# Patient Record
Sex: Female | Born: 1990 | Race: Black or African American | Hispanic: No | Marital: Single | Smoking: Never smoker
Health system: Southern US, Community
[De-identification: ages and names within clinical notes are randomized; demographics above are authoritative.]

## PROBLEM LIST (undated history)

## (undated) DIAGNOSIS — J45909 Unspecified asthma, uncomplicated: Secondary | ICD-10-CM

## (undated) DIAGNOSIS — I2699 Other pulmonary embolism without acute cor pulmonale: Secondary | ICD-10-CM

## (undated) HISTORY — PX: OVARIAN CYST REMOVAL: SHX89

## (undated) HISTORY — PX: HERNIA REPAIR: SHX51

---

## 2019-12-29 ENCOUNTER — Emergency Department (HOSPITAL_BASED_OUTPATIENT_CLINIC_OR_DEPARTMENT_OTHER)
Admission: EM | Admit: 2019-12-29 | Discharge: 2019-12-29 | Disposition: A | Discharge status: Home / Self Care | Payer: Self-pay | Attending: Emergency Medicine | Admitting: Emergency Medicine

## 2019-12-29 ENCOUNTER — Encounter (HOSPITAL_BASED_OUTPATIENT_CLINIC_OR_DEPARTMENT_OTHER): Payer: Self-pay | Admitting: Emergency Medicine

## 2019-12-29 ENCOUNTER — Other Ambulatory Visit: Payer: Self-pay

## 2019-12-29 DIAGNOSIS — M542 Cervicalgia: Secondary | ICD-10-CM | POA: Insufficient documentation

## 2019-12-29 DIAGNOSIS — M545 Low back pain, unspecified: Secondary | ICD-10-CM

## 2019-12-29 DIAGNOSIS — J45909 Unspecified asthma, uncomplicated: Secondary | ICD-10-CM | POA: Insufficient documentation

## 2019-12-29 HISTORY — DX: Unspecified asthma, uncomplicated: J45.909

## 2019-12-29 MED ORDER — NAPROXEN 500 MG PO TABS
500.0000 mg | ORAL_TABLET | Freq: Two times a day (BID) | ORAL | 0 refills | Status: DC
Start: 2019-12-29 — End: 2021-04-04

## 2019-12-29 MED ORDER — KETOROLAC TROMETHAMINE 30 MG/ML IJ SOLN
30.0000 mg | Freq: Once | INTRAMUSCULAR | Status: AC
  Administered 2019-12-29: 30 mg via INTRAMUSCULAR
  Filled 2019-12-29: qty 1

## 2019-12-29 MED ORDER — METHOCARBAMOL 500 MG PO TABS: 500.0000 mg | ORAL_TABLET | Freq: Two times a day (BID) | ORAL | 0 refills | Status: DC
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
## 2019-12-29 NOTE — Discharge Instructions (Signed)
As discussed, I suspect your symptoms are related to muscular related pain.  I am sending you home with a pain medication and muscle relaxer.  Muscle relaxer can cause drowsiness, so do not drive or operate machinery while on the medication.  You may also purchase over-the-counter Lidoderm patches and Voltaren gel for added pain relief.  If symptoms do not improve over the next few weeks, please follow-up with PCP for further evaluation.  Return to the ER for new or worsening symptoms.

## 2020-04-01 ENCOUNTER — Other Ambulatory Visit: Payer: Self-pay

## 2020-04-01 ENCOUNTER — Encounter (HOSPITAL_BASED_OUTPATIENT_CLINIC_OR_DEPARTMENT_OTHER): Payer: Self-pay | Admitting: *Deleted

## 2020-04-01 ENCOUNTER — Emergency Department (HOSPITAL_BASED_OUTPATIENT_CLINIC_OR_DEPARTMENT_OTHER)
Admission: EM | Admit: 2020-04-01 | Discharge: 2020-04-01 | Disposition: A | Discharge status: Home / Self Care | Payer: BC Managed Care – PPO | Attending: Emergency Medicine | Admitting: Emergency Medicine

## 2020-04-01 DIAGNOSIS — R0789 Other chest pain: Secondary | ICD-10-CM | POA: Diagnosis not present

## 2020-04-01 DIAGNOSIS — M791 Myalgia, unspecified site: Secondary | ICD-10-CM | POA: Insufficient documentation

## 2020-04-01 DIAGNOSIS — Z20822 Contact with and (suspected) exposure to covid-19: Secondary | ICD-10-CM

## 2020-04-01 DIAGNOSIS — J45909 Unspecified asthma, uncomplicated: Secondary | ICD-10-CM | POA: Insufficient documentation

## 2020-04-01 DIAGNOSIS — M549 Dorsalgia, unspecified: Secondary | ICD-10-CM | POA: Insufficient documentation

## 2020-04-01 DIAGNOSIS — R531 Weakness: Secondary | ICD-10-CM | POA: Diagnosis not present

## 2020-04-01 LAB — RESPIRATORY PANEL BY RT PCR (FLU A&B, COVID)
Influenza A by PCR: NEGATIVE
Influenza B by PCR: NEGATIVE
SARS Coronavirus 2 by RT PCR: NEGATIVE

## 2020-04-01 NOTE — Discharge Instructions (Addendum)
You were tested for COVID-19 today, make sure to quarantine until you receive the results. If this is positive, please make sure to quarantine additional 7 to 10 days. You may take ibuprofen/Tylenol for body aches and fevers, drink plenty of fluids, over-the-counter cold and flu medications. You may also buy an over-the-counter pulse oximeter which can be found at your local pharmacy. If your oxygen saturation drops below 90%, please make sure to return to the ER. Return to the ER for any worsening shortness of breath.  

## 2020-04-01 NOTE — ED Provider Notes (Signed)
[facility] EMERGENCY DEPARTMENT Provider Note   CSN: [id number] Arrival date & time: 04/01/20  1824     History Chief Complaint  Patient presents with  . URI    Andrea Preston is a 29 y.o. female.  HPI 29 year old female with history of asthma presents to the ER with complaints of generalized backache, weakness, body aches, mild chest tightness over the last 5 days.  States she was exposed to someone with Covid approximately a week ago.  She did have Covid back in July.  Denies any wheezing, chest pain, productive cough, loss of taste or smell.  Has not taken anything for her symptoms.  She is not vaccinated.  Denies any diarrhea, nausea, vomiting    Past Medical History:  Diagnosis Date  . Asthma     There are no problems to display for this patient.   History reviewed. No pertinent surgical history.   OB History   No obstetric history on file.     No family history on file.  Social History   Tobacco Use  . Smoking status: Never Smoker  . Smokeless tobacco: Never Used  Substance Use Topics  . Alcohol use: Not Currently  . Drug use: Not Currently    Home Medications Prior to Admission medications   Medication Sig Start Date End Date Taking? Authorizing Provider  methocarbamol (ROBAXIN) 500 MG tablet Take 1 tablet (500 mg total) by mouth 2 (two) times daily. 12/29/19   Mannie Stabile, PA-C  naproxen (NAPROSYN) 500 MG tablet Take 1 tablet (500 mg total) by mouth 2 (two) times daily. 12/29/19   Mannie Stabile, PA-C    Allergies    Patient has no known allergies.  Review of Systems   Review of Systems  Constitutional: Positive for appetite change and fatigue.  Respiratory: Negative for cough, shortness of breath and wheezing.   Cardiovascular: Negative for chest pain.  Gastrointestinal: Negative for abdominal pain, diarrhea, nausea and vomiting.  Genitourinary: Negative for dysuria.  Neurological: Positive for weakness.    Physical  Exam Updated Vital Signs BP 128/73 (BP Location: Right Arm)   Pulse 95   Temp 98.2 F (36.8 C) (Oral)   Resp 18   Ht 5\' 2"  (1.575 m)   Wt 86.2 kg   LMP 03/13/2020   SpO2 100%   BMI 34.75 kg/m   Physical Exam Vitals and nursing note reviewed.  Constitutional:      General: She is not in acute distress.    Appearance: She is well-developed. She is not ill-appearing, toxic-appearing or diaphoretic.  HENT:     Head: Normocephalic and atraumatic.  Eyes:     Conjunctiva/sclera: Conjunctivae normal.  Cardiovascular:     Rate and Rhythm: Normal rate and regular rhythm.     Heart sounds: No murmur heard.   Pulmonary:     Effort: Pulmonary effort is normal. No respiratory distress.     Breath sounds: Normal breath sounds.     Comments: Respirations equal and unlabored, patient able to speak in full sentences, lungs clear to auscultation bilaterally  Abdominal:     General: Abdomen is flat.     Palpations: Abdomen is soft.     Tenderness: There is no abdominal tenderness. There is no right CVA tenderness or left CVA tenderness.  Musculoskeletal:        General: Normal range of motion.     Cervical back: Neck supple.     Right lower leg: No edema.  Left lower leg: No edema.  Skin:    General: Skin is warm and dry.     Findings: No erythema.  Neurological:     General: No focal deficit present.     Mental Status: She is alert and oriented to person, place, and time.     ED Results / Procedures / Treatments   Labs (all labs ordered are listed, but only abnormal results are displayed) Labs Reviewed  RESPIRATORY PANEL BY RT PCR (FLU A&B, COVID)    EKG None  Radiology No results found.  Procedures Procedures (including critical care time)  Medications Ordered in ED Medications - No data to display  ED Course  I have reviewed the triage vital signs and the nursing notes.  Pertinent labs & imaging results that were available during my care of the patient were  reviewed by me and considered in my medical decision making (see chart for details).    MDM Rules/Calculators/A&P                          Patient with complaints of Covid-like symptoms.  Patient denies any cough, low concern for pneumonia.  She has no flank tenderness to suggest pyelonephritis.  Denies any urinary complaints.  Vitals overall reassuring, afebrile, no evidence of hypoxia.  She was tested for Covid here in the ED, instructed to quarantine until she receives her results.  Discussed supportive measures, she voiced understanding and is agreeable.  At this stage in the ED course, the patient is medically screened and stable for discharge.  Andrea Preston was evaluated in Emergency Department on 04/01/2020 for the symptoms described in the history of present illness. She was evaluated in the context of the global COVID-19 pandemic, which necessitated consideration that the patient might be at risk for infection with the SARS-CoV-2 virus that causes COVID-19. Institutional protocols and algorithms that pertain to the evaluation of patients at risk for COVID-19 are in a state of rapid change based on information released by regulatory bodies including the CDC and federal and state organizations. These policies and algorithms were followed during the patient's care in the ED.  Final Clinical Impression(s) / ED Diagnoses Final diagnoses:  Encounter for laboratory testing for COVID-19 virus    Rx / DC Orders ED Discharge Orders    None       Leone Brand 04/01/20 1925    Sabas Sous, MD 04/01/20 2242

## 2020-04-01 NOTE — ED Triage Notes (Signed)
C/o covid symptoms x 5 days , exposure x 1 week ago

## 2020-06-11 ENCOUNTER — Encounter (HOSPITAL_BASED_OUTPATIENT_CLINIC_OR_DEPARTMENT_OTHER): Payer: Self-pay | Admitting: Emergency Medicine

## 2020-06-11 ENCOUNTER — Emergency Department (HOSPITAL_BASED_OUTPATIENT_CLINIC_OR_DEPARTMENT_OTHER)
Admission: EM | Admit: 2020-06-11 | Discharge: 2020-06-11 | Disposition: A | Discharge status: Home / Self Care | Payer: BC Managed Care – PPO | Attending: Emergency Medicine | Admitting: Emergency Medicine

## 2020-06-11 ENCOUNTER — Other Ambulatory Visit: Payer: Self-pay

## 2020-06-11 DIAGNOSIS — Z5321 Procedure and treatment not carried out due to patient leaving prior to being seen by health care provider: Secondary | ICD-10-CM | POA: Insufficient documentation

## 2020-06-11 DIAGNOSIS — R079 Chest pain, unspecified: Secondary | ICD-10-CM | POA: Diagnosis not present

## 2020-06-11 DIAGNOSIS — J45909 Unspecified asthma, uncomplicated: Secondary | ICD-10-CM | POA: Insufficient documentation

## 2020-06-11 DIAGNOSIS — R0602 Shortness of breath: Secondary | ICD-10-CM | POA: Diagnosis not present

## 2020-06-11 NOTE — ED Triage Notes (Signed)
Reports possible covid exposure. Son exposes and symptomatic today. She reports some chest pain and shortness of breath. Hx asthma. deneis cough nor further symptoms

## 2020-08-30 ENCOUNTER — Other Ambulatory Visit: Payer: Self-pay

## 2020-08-30 ENCOUNTER — Encounter (HOSPITAL_BASED_OUTPATIENT_CLINIC_OR_DEPARTMENT_OTHER): Payer: Self-pay | Admitting: *Deleted

## 2020-08-30 ENCOUNTER — Emergency Department (HOSPITAL_BASED_OUTPATIENT_CLINIC_OR_DEPARTMENT_OTHER)
Admission: EM | Admit: 2020-08-30 | Discharge: 2020-08-30 | Disposition: A | Discharge status: Home / Self Care | Payer: BC Managed Care – PPO | Attending: Emergency Medicine | Admitting: Emergency Medicine

## 2020-08-30 DIAGNOSIS — J069 Acute upper respiratory infection, unspecified: Secondary | ICD-10-CM | POA: Insufficient documentation

## 2020-08-30 DIAGNOSIS — Z20822 Contact with and (suspected) exposure to covid-19: Secondary | ICD-10-CM | POA: Insufficient documentation

## 2020-08-30 DIAGNOSIS — J45909 Unspecified asthma, uncomplicated: Secondary | ICD-10-CM | POA: Diagnosis not present

## 2020-08-30 DIAGNOSIS — R059 Cough, unspecified: Secondary | ICD-10-CM | POA: Diagnosis present

## 2020-08-30 NOTE — ED Provider Notes (Signed)
[facility] EMERGENCY DEPARTMENT Provider Note   CSN: [id number] Arrival date & time: 08/30/20  1803     History Chief Complaint  Patient presents with  . Cough    Andrea Preston is a 30 y.o. female.  The history is provided by the patient.  Cough  Andrea Preston is a 30 y.o. female who presents to the Emergency Department complaining of cough. She presents the emergency department complaining of cough, dry throat and body aches that started today. Two of her children are currently sick with upper respiratory tract infections. She did have a child visiting the home two days ago that tested positive for influenza today. No reports of fevers. She does have a history of asthma. She has no additional medical problems. She denies any chance of pregnancy. She had one dose of the COVID vaccine sometime last year. Symptoms are mild to moderate in nature.    Past Medical History:  Diagnosis Date  . Asthma     There are no problems to display for this patient.   History reviewed. No pertinent surgical history.   OB History   No obstetric history on file.     History reviewed. No pertinent family history.  Social History   Tobacco Use  . Smoking status: Never Smoker  . Smokeless tobacco: Never Used  Substance Use Topics  . Alcohol use: Not Currently  . Drug use: Not Currently    Home Medications Prior to Admission medications   Medication Sig Start Date End Date Taking? Authorizing Provider  dicyclomine (BENTYL) 20 MG tablet Take by mouth. 10/20/17   [provider]  methocarbamol (ROBAXIN) 500 MG tablet Take 1 tablet (500 mg total) by mouth 2 (two) times daily. 12/29/19   Mannie Stabile, PA-C  metroNIDAZOLE (FLAGYL) 500 MG tablet Take 500 mg by mouth 2 (two) times daily. 05/02/20   [provider]  metroNIDAZOLE (METROGEL) 0.75 % vaginal gel Place vaginally at bedtime. 07/28/20   [provider]  naproxen (NAPROSYN) 500 MG tablet Take 1  tablet (500 mg total) by mouth 2 (two) times daily. 12/29/19   Mannie Stabile, PA-C    Allergies    Patient has no known allergies.  Review of Systems   Review of Systems  Respiratory: Positive for cough.   All other systems reviewed and are negative.   Physical Exam Updated Vital Signs BP 116/79 (BP Location: Right Arm)   Pulse (!) 101   Temp 98.4 F (36.9 C) (Oral)   Resp 18   Ht 5\' 2"  (1.575 m)   Wt 92.5 kg   SpO2 100%   BMI 37.31 kg/m   Physical Exam Vitals and nursing note reviewed.  Constitutional:      Appearance: She is well-developed.  HENT:     Head: Normocephalic and atraumatic.  Cardiovascular:     Rate and Rhythm: Normal rate and regular rhythm.     Heart sounds: No murmur heard.   Pulmonary:     Effort: Pulmonary effort is normal. No respiratory distress.     Breath sounds: Normal breath sounds.  Musculoskeletal:        General: No tenderness.  Skin:    General: Skin is warm and dry.  Neurological:     Mental Status: She is alert and oriented to person, place, and time.  Psychiatric:        Behavior: Behavior normal.     ED Results / Procedures / Treatments   Labs (all labs ordered  are listed, but only abnormal results are displayed) Labs Reviewed  RAPID INFLUENZA A&B ANTIGENS  SARS CORONAVIRUS 2 (TAT 6-24 HRS)    EKG None  Radiology No results found.  Procedures Procedures   Medications Ordered in ED Medications - No data to display  ED Course  I have reviewed the triage vital signs and the nursing notes.  Pertinent labs & imaging results that were available during my care of the patient were reviewed by me and considered in my medical decision making (see chart for details).    MDM Rules/Calculators/A&P                         patient here for evaluation of cough, body aches. She did have a flu exposure a few days ago. She has no respiratory distress on examination, clear lungs, no wheezing. She is partially vaccinated  for COVID-19. Will send COVID and flu test. Results are not immediately available at this time. Discussed with patient following up in my chart for results. Discussed home care for viral upper respiratory infection with outpatient follow-up and return precautions.  Andrea Preston was evaluated in Emergency Department on 08/30/2020 for the symptoms described in the history of present illness. She was evaluated in the context of the global COVID-19 pandemic, which necessitated consideration that the patient might be at risk for infection with the SARS-CoV-2 virus that causes COVID-19. Institutional protocols and algorithms that pertain to the evaluation of patients at risk for COVID-19 are in a state of rapid change based on information released by regulatory bodies including the CDC and federal and state organizations. These policies and algorithms were followed during the patient's care in the ED.   Final Clinical Impression(s) / ED Diagnoses Final diagnoses:  Viral URI with cough    Rx / DC Orders ED Discharge Orders    None       Tilden Fossa, MD 08/30/20 [id number]

## 2020-08-30 NOTE — ED Triage Notes (Signed)
States she awoke this am with a cough, sneezing, runny nose, has been exposed to the flu from a family member. Denies any fevers, vomiting or diarrhea, states she feels she is belching more, has not had the flu vaccine, but only 1 Covid Vaccine

## 2020-08-31 LAB — SARS CORONAVIRUS 2 (TAT 6-24 HRS): SARS Coronavirus 2: NEGATIVE

## 2021-03-17 ENCOUNTER — Encounter (HOSPITAL_BASED_OUTPATIENT_CLINIC_OR_DEPARTMENT_OTHER): Payer: Self-pay

## 2021-03-17 ENCOUNTER — Emergency Department (HOSPITAL_BASED_OUTPATIENT_CLINIC_OR_DEPARTMENT_OTHER)
Admission: EM | Admit: 2021-03-17 | Discharge: 2021-03-17 | Disposition: A | Discharge status: Home / Self Care | Payer: BC Managed Care – PPO | Attending: Emergency Medicine | Admitting: Emergency Medicine

## 2021-03-17 ENCOUNTER — Other Ambulatory Visit: Payer: Self-pay

## 2021-03-17 DIAGNOSIS — G43809 Other migraine, not intractable, without status migrainosus: Secondary | ICD-10-CM | POA: Insufficient documentation

## 2021-03-17 DIAGNOSIS — J45909 Unspecified asthma, uncomplicated: Secondary | ICD-10-CM | POA: Insufficient documentation

## 2021-03-17 MED ORDER — IBUPROFEN 800 MG PO TABS: 800.0000 mg | ORAL_TABLET | Freq: Three times a day (TID) | ORAL | 0 refills | Status: DC

## 2021-03-17 MED ORDER — DIPHENHYDRAMINE HCL 25 MG PO TABS: 25.0000 mg | ORAL_TABLET | Freq: Four times a day (QID) | ORAL | 0 refills | Status: DC | PRN

## 2021-03-17 MED ORDER — ACETAMINOPHEN 500 MG PO TABS
1000.0000 mg | ORAL_TABLET | Freq: Once | ORAL | Status: AC
  Administered 2021-03-17: 1000 mg via ORAL
  Filled 2021-03-17: qty 2

## 2021-03-17 MED ORDER — KETOROLAC TROMETHAMINE 60 MG/2ML IM SOLN
60.0000 mg | Freq: Once | INTRAMUSCULAR | Status: AC
  Administered 2021-03-17: 60 mg via INTRAMUSCULAR
  Filled 2021-03-17: qty 2

## 2021-03-17 MED ORDER — METOCLOPRAMIDE HCL 10 MG PO TABS
10.0000 mg | ORAL_TABLET | Freq: Four times a day (QID) | ORAL | 0 refills | Status: DC
Start: 2021-03-17 — End: 2022-03-15

## 2021-03-17 NOTE — ED Provider Notes (Signed)
[facility] EMERGENCY DEPARTMENT Provider Note   CSN: [id number] Arrival date & time: 03/17/21  2031     History No chief complaint on file.   Andrea Preston is a 30 y.o. female.  HPI Patient reports she had a migraine headache for about 2 days.  She reports this started gradually.  She has pain behind her forehead.  She reports it has sharp stabbing and aching quality.  She reports it did not get any better with Tylenol at home.  Patient reports she is had some migraine medications to take but oftentimes they do not help.  She reports this is similar to prior headaches.  No fevers, no chills, no visual changes.  No neck stiffness.  No gait incoordination.    Past Medical History:  Diagnosis Date   Asthma     There are no problems to display for this patient.   History reviewed. No pertinent surgical history.   OB History   No obstetric history on file.     History reviewed. No pertinent family history.  Social History   Tobacco Use   Smoking status: Never   Smokeless tobacco: Never  Substance Use Topics   Alcohol use: Not Currently   Drug use: Not Currently    Home Medications Prior to Admission medications   Medication Sig Start Date End Date Taking? Authorizing Provider  diphenhydrAMINE (BENADRYL) 25 MG tablet Take 1 tablet (25 mg total) by mouth every 6 (six) hours as needed. 03/17/21  Yes Arby Barrette, MD  ibuprofen (ADVIL) 800 MG tablet Take 1 tablet (800 mg total) by mouth 3 (three) times daily. 03/17/21  Yes Arby Barrette, MD  metoCLOPramide (REGLAN) 10 MG tablet Take 1 tablet (10 mg total) by mouth every 6 (six) hours. 03/17/21  Yes Arby Barrette, MD  dicyclomine (BENTYL) 20 MG tablet Take by mouth. 10/20/17   [provider]  methocarbamol (ROBAXIN) 500 MG tablet Take 1 tablet (500 mg total) by mouth 2 (two) times daily. 12/29/19   Mannie Stabile, PA-C  metroNIDAZOLE (FLAGYL) 500 MG tablet Take 500 mg by mouth 2 (two) times daily.  05/02/20   [provider]  metroNIDAZOLE (METROGEL) 0.75 % vaginal gel Place vaginally at bedtime. 07/28/20   [provider]  naproxen (NAPROSYN) 500 MG tablet Take 1 tablet (500 mg total) by mouth 2 (two) times daily. 12/29/19   Mannie Stabile, PA-C    Allergies    Patient has no known allergies.  Review of Systems   Review of Systems 10 systems reviewed and negative except as per HPI Physical Exam Updated Vital Signs BP 131/85 (BP Location: Right Arm)   Pulse 81   Temp 98.4 F (36.9 C) (Oral)   Resp 18   Ht 5\' 2"  (1.575 m)   Wt 88.5 kg   LMP 03/08/2021 (Approximate)   SpO2 100%   BMI 35.67 kg/m   Physical Exam Constitutional:      Comments: Alert nontoxic.  Clear mental status.  No acute distress  HENT:     Head: Normocephalic and atraumatic.     Nose: Nose normal.     Mouth/Throat:     Mouth: Mucous membranes are moist.     Pharynx: Oropharynx is clear.  Eyes:     Extraocular Movements: Extraocular movements intact.     Conjunctiva/sclera: Conjunctivae normal.     Pupils: Pupils are equal, round, and reactive to light.  Cardiovascular:     Rate and Rhythm: Normal rate and regular  rhythm.  Pulmonary:     Effort: Pulmonary effort is normal.     Breath sounds: Normal breath sounds.  Abdominal:     General: There is no distension.     Palpations: Abdomen is soft.  Musculoskeletal:        General: Normal range of motion.     Cervical back: Neck supple.  Skin:    General: Skin is warm and dry.  Neurological:     General: No focal deficit present.     Mental Status: She is oriented to person, place, and time.     Cranial Nerves: No cranial nerve deficit.     Motor: No weakness.     Coordination: Coordination normal.  Psychiatric:        Mood and Affect: Mood normal.    ED Results / Procedures / Treatments   Labs (all labs ordered are listed, but only abnormal results are displayed) Labs Reviewed - No data to  display  EKG None  Radiology No results found.  Procedures Procedures   Medications Ordered in ED Medications  ketorolac (TORADOL) injection 60 mg (has no administration in time range)  acetaminophen (TYLENOL) tablet 1,000 mg (has no administration in time range)    ED Course  I have reviewed the triage vital signs and the nursing notes.  Pertinent labs & imaging results that were available during my care of the patient were reviewed by me and considered in my medical decision making (see chart for details).    MDM Rules/Calculators/A&P                           Patient presents with headache.  She reports history of migraine headaches for 5 years.  Headache was gradual in onset.  No associated neurologic symptoms.  Typical headache for the patient.  She is neurologically intact and clinically well in appearance.  Patient reports that she is driving herself this evening and does not have a ride.  She will be given IM Toradol.  Patient prefers to pick her medications up at the pharmacy. Stable for continued home management and follow-up with PCP.  Final Clinical Impression(s) / ED Diagnoses Final diagnoses:  Other migraine without status migrainosus, not intractable    Rx / DC Orders ED Discharge Orders          Ordered    metoCLOPramide (REGLAN) 10 MG tablet  Every 6 hours        03/17/21 2252    diphenhydrAMINE (BENADRYL) 25 MG tablet  Every 6 hours PRN        03/17/21 2252    ibuprofen (ADVIL) 800 MG tablet  3 times daily        03/17/21 2252             Arby Barrette, MD 03/17/21 2256

## 2021-03-17 NOTE — ED Triage Notes (Signed)
Pt c/o migraine x 2 days. Hx of same. Hasnt taken any medications. Denies N/V.

## 2021-03-17 NOTE — Discharge Instructions (Signed)
1.  You may take a combination of ibuprofen, Benadryl and Reglan as prescribed every 8 hours for migraine headache. 2.  Schedule a follow-up with check with your primary care doctor. 3.  Return to the emergency department for headache with problems with your vision incoordination imbalance or other concerning symptoms.

## 2021-04-04 ENCOUNTER — Encounter (HOSPITAL_BASED_OUTPATIENT_CLINIC_OR_DEPARTMENT_OTHER): Payer: Self-pay | Admitting: Emergency Medicine

## 2021-04-04 ENCOUNTER — Emergency Department (HOSPITAL_BASED_OUTPATIENT_CLINIC_OR_DEPARTMENT_OTHER)
Admission: EM | Admit: 2021-04-04 | Discharge: 2021-04-04 | Disposition: A | Discharge status: Home / Self Care | Payer: Medicaid Other | Attending: Emergency Medicine | Admitting: Emergency Medicine

## 2021-04-04 ENCOUNTER — Other Ambulatory Visit: Payer: Self-pay

## 2021-04-04 ENCOUNTER — Emergency Department (HOSPITAL_BASED_OUTPATIENT_CLINIC_OR_DEPARTMENT_OTHER): Payer: Medicaid Other

## 2021-04-04 DIAGNOSIS — J45909 Unspecified asthma, uncomplicated: Secondary | ICD-10-CM | POA: Insufficient documentation

## 2021-04-04 DIAGNOSIS — R0789 Other chest pain: Secondary | ICD-10-CM | POA: Insufficient documentation

## 2021-04-04 DIAGNOSIS — R079 Chest pain, unspecified: Secondary | ICD-10-CM

## 2021-04-04 LAB — BASIC METABOLIC PANEL
Anion gap: 7 (ref 5–15)
BUN: 9 mg/dL (ref 6–20)
CO2: 27 mmol/L (ref 22–32)
Calcium: 9.2 mg/dL (ref 8.9–10.3)
Chloride: 102 mmol/L (ref 98–111)
Creatinine, Ser: 0.69 mg/dL (ref 0.44–1.00)
GFR, Estimated: >60 mL/min (ref >60)
Glucose, Bld: 80 mg/dL (ref 70–99)
Potassium: 4.1 mmol/L (ref 3.5–5.1)
Sodium: 136 mmol/L (ref 135–145)

## 2021-04-04 LAB — CBC
HCT: 42 % (ref 36.0–46.0)
Hemoglobin: 14.3 g/dL (ref 12.0–15.0)
MCH: 28.7 pg (ref 26.0–34.0)
MCHC: 34 g/dL (ref 30.0–36.0)
MCV: 84.2 fL (ref 80.0–100.0)
Platelets: 289 10*3/uL (ref 150–400)
RBC: 4.99 MIL/uL (ref 3.87–5.11)
RDW: 13.1 % (ref 11.5–15.5)
WBC: 5 10*3/uL (ref 4.0–10.5)
nRBC: 0 % (ref 0.0–0.2)

## 2021-04-04 LAB — HEPATIC FUNCTION PANEL
ALT: 22 U/L (ref 0–44)
AST: 21 U/L (ref 15–41)
Albumin: 4 g/dL (ref 3.5–5.0)
Alkaline Phosphatase: 69 U/L (ref 38–126)
Bilirubin, Direct: 0.1 mg/dL (ref 0.0–0.2)
Indirect Bilirubin: 0.4 mg/dL (ref 0.3–0.9)
Total Bilirubin: 0.5 mg/dL (ref 0.3–1.2)
Total Protein: 7.8 g/dL (ref 6.5–8.1)

## 2021-04-04 LAB — TROPONIN I (HIGH SENSITIVITY): Troponin I (High Sensitivity): <2 ng/L (ref <18)

## 2021-04-04 LAB — LIPASE, BLOOD: Lipase: 27 U/L (ref 11–51)

## 2021-04-04 MED ORDER — NAPROXEN 500 MG PO TABS: 500.0000 mg | ORAL_TABLET | Freq: Two times a day (BID) | ORAL | 0 refills | Status: AC

## 2021-04-04 MED ORDER — KETOROLAC TROMETHAMINE 15 MG/ML IJ SOLN
15.0000 mg | Freq: Once | INTRAMUSCULAR | Status: AC
  Administered 2021-04-04: 15 mg via INTRAMUSCULAR
  Filled 2021-04-04: qty 1

## 2021-04-04 NOTE — Discharge Instructions (Addendum)
You have been seen in the emergency department for chest pain.  All of your testing and imaging returned normal.  I believe the cause of your past pain is due to a musculoskeletal irritation.  I prescribed you 2 weeks of anti-inflammatories to help alleviate your symptoms.  Do not take this medication longer than 2 weeks.  Please return to the emergency department if your symptoms worsen.  Recommend follow-up with your PCP reevaluate your symptoms within the next week.  If you do not have a PCP, please contact [facility] and set up an appointment with them.

## 2021-04-04 NOTE — ED Triage Notes (Signed)
Pt reports right sided chest pain onset this morning while at work. 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 Pain radiated down right arm. Pt denies any other symptoms

## 2021-04-04 NOTE — ED Provider Notes (Signed)
[facility] EMERGENCY DEPARTMENT Provider Note   CSN: [id number] Arrival date & time: 04/04/21  0940     History Chief Complaint  Patient presents with   Chest Pain    Andrea Preston is a 30 y.o. female. Patient presents with sudden onset right-sided chest pain that started this morning while she was mopping at work.  She says that the pain is worse when taking a deep breath.  The pain has been constant since it started.  Rates it 9 out of 10.  States that she has some radiation that goes down her right arm.  Denies any numbness, tingling, weakness in upper or lower extremities.  Pain is worse with movements.  Denies any trauma to the area.  Patient states that she is not on any birth control or other hormonal therapies.  She has not had recent surgery or leg swelling that she has noticed.  Denies any shortness of breath.  She has an active job where she is on her feet a lot.  Denies any cough or hemoptysis.  She does say that she has possible ovarian cancer that she is being evaluated for through [facility], however when she described it, sounded more like she was having a abnormal cervical Pap smear rather than being treated for cancer.  We will chart review further for this.  Denies any abdominal pain, nausea, vomiting.  Chest Pain Associated symptoms: no abdominal pain, no back pain, no cough, no dizziness, no fever, no headache, no nausea, no palpitations, no shortness of breath, no vomiting and no weakness    HPI: A 30 year old patient with a history of obesity presents for evaluation of chest pain. Initial onset of pain was less than one hour ago. The patient's chest pain is not worse with exertion. The patient's chest pain is not middle- or left-sided, is not well-localized, is not described as heaviness/pressure/tightness, is not sharp and does radiate to the arms/jaw/neck. The patient does not complain of nausea and denies diaphoresis. The patient has no history of stroke, has no  history of peripheral artery disease, has not smoked in the past 90 days, denies any history of treated diabetes, has no relevant family history of coronary artery disease (first degree relative at less than age 33), is not hypertensive and has no history of hypercholesterolemia.   Past Medical History:  Diagnosis Date   Asthma     There are no problems to display for this patient.   Past Surgical History:  Procedure Laterality Date   HERNIA REPAIR       OB History   No obstetric history on file.     No family history on file.  Social History   Tobacco Use   Smoking status: Never   Smokeless tobacco: Never  Vaping Use   Vaping Use: Never used  Substance Use Topics   Alcohol use: Yes    Comment: occ   Drug use: Not Currently    Home Medications Prior to Admission medications   Medication Sig Start Date End Date Taking? Authorizing Provider  naproxen (NAPROSYN) 500 MG tablet Take 1 tablet (500 mg total) by mouth 2 (two) times daily for 14 days. 04/04/21 04/18/21 Yes Presli Fanguy, Finis Bud, PA-C  dicyclomine (BENTYL) 20 MG tablet Take by mouth. 10/20/17   [provider]  diphenhydrAMINE (BENADRYL) 25 MG tablet Take 1 tablet (25 mg total) by mouth every 6 (six) hours as needed. 03/17/21   Arby Barrette, MD  ibuprofen (ADVIL) 800 MG tablet Take  1 tablet (800 mg total) by mouth 3 (three) times daily. 03/17/21   Arby Barrette, MD  methocarbamol (ROBAXIN) 500 MG tablet Take 1 tablet (500 mg total) by mouth 2 (two) times daily. 12/29/19   Mannie Stabile, PA-C  metoCLOPramide (REGLAN) 10 MG tablet Take 1 tablet (10 mg total) by mouth every 6 (six) hours. 03/17/21   Arby Barrette, MD  metroNIDAZOLE (FLAGYL) 500 MG tablet Take 500 mg by mouth 2 (two) times daily. 05/02/20   [provider]  metroNIDAZOLE (METROGEL) 0.75 % vaginal gel Place vaginally at bedtime. 07/28/20   [provider]    Allergies    Patient has no known allergies.  Review of  Systems   Review of Systems  Constitutional:  Negative for chills and fever.  HENT:  Negative for congestion, rhinorrhea and sore throat.   Eyes:  Negative for visual disturbance.  Respiratory:  Negative for cough, chest tightness and shortness of breath.   Cardiovascular:  Positive for chest pain. Negative for palpitations and leg swelling.  Gastrointestinal:  Negative for abdominal pain, blood in stool, constipation, diarrhea, nausea and vomiting.  Genitourinary:  Negative for dysuria, flank pain and hematuria.  Musculoskeletal:  Negative for back pain.  Skin:  Negative for rash and wound.  Neurological:  Negative for dizziness, syncope, weakness, light-headedness and headaches.  Psychiatric/Behavioral:  Negative for confusion.   All other systems reviewed and are negative.  Physical Exam Updated Vital Signs BP (!) 105/58 (BP Location: Right Arm)   Pulse 66   Temp 98.5 F (36.9 C) (Oral)   Resp 16   Ht 5\' 2"  (1.575 m)   Wt 88.5 kg   LMP 03/28/2021 (Approximate)   SpO2 100%   BMI 35.67 kg/m   Physical Exam Vitals and nursing note reviewed.  Constitutional:      General: She is not in acute distress.    Appearance: Normal appearance. She is not ill-appearing, toxic-appearing or diaphoretic.  HENT:     Head: Normocephalic and atraumatic.     Nose: No nasal deformity.     Mouth/Throat:     Lips: Pink. No lesions.     Mouth: Mucous membranes are dry. No injury, lacerations, oral lesions or angioedema.     Pharynx: Oropharynx is clear. Uvula midline. No pharyngeal swelling, oropharyngeal exudate, posterior oropharyngeal erythema or uvula swelling.  Eyes:     General: Gaze aligned appropriately. No scleral icterus.       Right eye: No discharge.        Left eye: No discharge.     Conjunctiva/sclera: Conjunctivae normal.     Right eye: Right conjunctiva is not injected. No exudate or hemorrhage.    Left eye: Left conjunctiva is not injected. No exudate or hemorrhage.     Pupils: Pupils are equal, round, and reactive to light.  Cardiovascular:     Rate and Rhythm: Normal rate and regular rhythm.     Pulses: Normal pulses.          Radial pulses are 2+ on the right side and 2+ on the left side.       Dorsalis pedis pulses are 2+ on the right side and 2+ on the left side.     Heart sounds: Normal heart sounds, S1 normal and S2 normal. Heart sounds not distant. No murmur heard.   No friction rub. No gallop. No S3 or S4 sounds.     Comments: 2+ radial pulses that are equal bilaterally.  2+ dorsalis pedis  pulses equal bilaterally.  No lower extremity edema noted.  No upper extremity edema noted. Pulmonary:     Effort: Pulmonary effort is normal. No accessory muscle usage or respiratory distress.     Breath sounds: Normal breath sounds. No stridor. No wheezing, rhonchi or rales.  Chest:     Chest wall: Tenderness present.     Comments: On palpation of right upper chest, significant pain is reproducible.  No reproducible pain on the chest.  No point tenderness of clavicle or right shoulder. Abdominal:     General: Abdomen is flat. Bowel sounds are normal. There is no distension.     Palpations: Abdomen is soft. There is no mass or pulsatile mass.     Tenderness: There is no abdominal tenderness. There is no guarding or rebound.     Comments: No abdominal tenderness to palpation in any quadrant.  Abdomen is soft, nondistended.  Musculoskeletal:     Right lower leg: No edema.     Left lower leg: No edema.  Skin:    General: Skin is warm and dry.     Coloration: Skin is not jaundiced or pale.     Findings: No bruising, erythema, lesion or rash.  Neurological:     General: No focal deficit present.     Mental Status: She is alert and oriented to person, place, and time.     GCS: GCS eye subscore is 4. GCS verbal subscore is 5. GCS motor subscore is 6.     Comments: Sensation intact in all 4 extremities.  Psychiatric:        Mood and Affect: Mood normal.         Behavior: Behavior normal. Behavior is cooperative.    ED Results / Procedures / Treatments   Labs (all labs ordered are listed, but only abnormal results are displayed) Labs Reviewed  BASIC METABOLIC PANEL  CBC  LIPASE, BLOOD  HEPATIC FUNCTION PANEL  TROPONIN I (HIGH SENSITIVITY)    EKG EKG Interpretation  Date/Time:  Sunday April 04 2021 09:52:07 EDT Ventricular Rate:  84 PR Interval:  144 QRS Duration: 72 QT Interval:  336 QTC Calculation: 397 R Axis:   50 Text Interpretation: Normal sinus rhythm with sinus arrhythmia Normal ECG No old tracing to compare Confirmed by Rolan Bucco [phone]) on 04/04/2021 12:50:18 PM  Radiology DG Chest 2 View  Result Date: 04/04/2021 CLINICAL DATA:  CP EXAM: CHEST - 2 VIEW COMPARISON:  None. FINDINGS: The cardiomediastinal silhouette is normal in contour. No pleural effusion. No pneumothorax. No acute pleuroparenchymal abnormality. Visualized abdomen is unremarkable. No acute osseous abnormality noted. IMPRESSION: No acute cardiopulmonary abnormality. Electronically Signed   By: Meda Klinefelter M.D.   On: 04/04/2021 13:06    Procedures Procedures   Medications Ordered in ED Medications  ketorolac (TORADOL) 15 MG/ML injection 15 mg (15 mg Intramuscular Given 04/04/21 1507)    ED Course  I have reviewed the triage vital signs and the nursing notes.  Pertinent labs & imaging results that were available during my care of the patient were reviewed by me and considered in my medical decision making (see chart for details).  Clinical Course as of 04/04/21 1953  Wynelle Link Apr 04, 2021  1323 I think patient's pain is likely secondary to musculoskeletal cause, however will rule out more serious etiologies. PERC negative. [GL]  1444 Labs reassuring. Imaging reassuring. Will trial with IM Toradol.  [GL]  1515 Heart score of 1. [GL]    Clinical Course User Index [  GL] Debrah Granderson, Finis Bud, PA-C   MDM Rules/Calculators/A&P HEAR Score: 1                        This is a 30 y.o. female who presents to the ED with sudden onset right sided chest pain with activity and radiation to right arm.  Vitals stable  Patient is well appearing and in no acute distress. Exam with reproducible anterior chest wall pain to palpation.  Overall nonconcerning physical exam. I suspect patient's pain is likely secondary to musculoskeletal cause, however will we will rule out more serious etiologies.  She is PERC negative, so low suspicion for pulmonary embolism.  Do not suspect dissection and no neurological deficits.  Heart score 1.  I personally reviewed all laboratory work and imaging. -Lab work is completely unremarkable.  Negative troponin.  Normal BNP, hepatic function panel, CBC.  Chest x-ray with no acute concerning findings.  EKG normal sinus rhythm.  Interventions: Trial of IM Toradol for pain.  On reassessment, pain is somewhat improved with IM Toradol.  Suspect patient's presentation is musculoskeletal in origin.  Patient discharged home with 2 weeks of naproxen.  Vitals continue to be stable.  Return precautions provided.  Stable for discharge.  Portions of this note were generated with Scientist, clinical (histocompatibility and immunogenetics). Dictation errors may occur despite best attempts at proofreading.  Final Clinical Impression(s) / ED Diagnoses Final diagnoses:  Nonspecific chest pain    Rx / DC Orders ED Discharge Orders          Ordered    naproxen (NAPROSYN) 500 MG tablet  2 times daily        04/04/21 1531             Timber Lucarelli, Finis Bud, PA-C 04/04/21 1953    Rolan Bucco, MD 04/05/21 1025

## 2021-04-04 NOTE — ED Notes (Signed)
Pt NAD, a/ox4. Pt verbalizes understanding of all DC and f/u instructions. All questions answered. Pt walks with steady gait to lobby at DC.  ? ?

## 2021-07-15 ENCOUNTER — Emergency Department (HOSPITAL_BASED_OUTPATIENT_CLINIC_OR_DEPARTMENT_OTHER)
Admission: EM | Admit: 2021-07-15 | Discharge: 2021-07-15 | Disposition: A | Discharge status: Home / Self Care | Payer: Medicaid Other | Attending: Emergency Medicine | Admitting: Emergency Medicine

## 2021-07-15 ENCOUNTER — Other Ambulatory Visit: Payer: Self-pay

## 2021-07-15 ENCOUNTER — Encounter (HOSPITAL_BASED_OUTPATIENT_CLINIC_OR_DEPARTMENT_OTHER): Payer: Self-pay | Admitting: *Deleted

## 2021-07-15 ENCOUNTER — Emergency Department (HOSPITAL_BASED_OUTPATIENT_CLINIC_OR_DEPARTMENT_OTHER): Payer: Medicaid Other

## 2021-07-15 DIAGNOSIS — R8271 Bacteriuria: Secondary | ICD-10-CM | POA: Diagnosis not present

## 2021-07-15 DIAGNOSIS — O26891 Other specified pregnancy related conditions, first trimester: Secondary | ICD-10-CM | POA: Diagnosis present

## 2021-07-15 DIAGNOSIS — O99891 Other specified diseases and conditions complicating pregnancy: Secondary | ICD-10-CM

## 2021-07-15 DIAGNOSIS — Z3A01 Less than 8 weeks gestation of pregnancy: Secondary | ICD-10-CM | POA: Diagnosis not present

## 2021-07-15 DIAGNOSIS — A599 Trichomoniasis, unspecified: Secondary | ICD-10-CM | POA: Diagnosis not present

## 2021-07-15 DIAGNOSIS — M542 Cervicalgia: Secondary | ICD-10-CM | POA: Diagnosis not present

## 2021-07-15 DIAGNOSIS — O23591 Infection of other part of genital tract in pregnancy, first trimester: Secondary | ICD-10-CM | POA: Insufficient documentation

## 2021-07-15 DIAGNOSIS — N9489 Other specified conditions associated with female genital organs and menstrual cycle: Secondary | ICD-10-CM | POA: Diagnosis not present

## 2021-07-15 DIAGNOSIS — R52 Pain, unspecified: Secondary | ICD-10-CM

## 2021-07-15 LAB — CBC WITH DIFFERENTIAL/PLATELET
Abs Immature Granulocytes: 0.01 10*3/uL (ref 0.00–0.07)
Basophils Absolute: 0 10*3/uL (ref 0.0–0.1)
Basophils Relative: 0 %
Eosinophils Absolute: 0.3 10*3/uL (ref 0.0–0.5)
Eosinophils Relative: 4 %
HCT: 40.7 % (ref 36.0–46.0)
Hemoglobin: 14.5 g/dL (ref 12.0–15.0)
Immature Granulocytes: 0 %
Lymphocytes Relative: 37 %
Lymphs Abs: 2.7 10*3/uL (ref 0.7–4.0)
MCH: 28.9 pg (ref 26.0–34.0)
MCHC: 35.6 g/dL (ref 30.0–36.0)
MCV: 81.2 fL (ref 80.0–100.0)
Monocytes Absolute: 0.5 10*3/uL (ref 0.1–1.0)
Monocytes Relative: 7 %
Neutro Abs: 3.9 10*3/uL (ref 1.7–7.7)
Neutrophils Relative %: 52 %
Platelets: 286 10*3/uL (ref 150–400)
RBC: 5.01 MIL/uL (ref 3.87–5.11)
RDW: 12.9 % (ref 11.5–15.5)
WBC: 7.4 10*3/uL (ref 4.0–10.5)
nRBC: 0 % (ref 0.0–0.2)

## 2021-07-15 LAB — COMPREHENSIVE METABOLIC PANEL
ALT: 15 U/L (ref 0–44)
AST: 17 U/L (ref 15–41)
Albumin: 3.8 g/dL (ref 3.5–5.0)
Alkaline Phosphatase: 64 U/L (ref 38–126)
Anion gap: 9 (ref 5–15)
BUN: 7 mg/dL (ref 6–20)
CO2: 23 mmol/L (ref 22–32)
Calcium: 9.1 mg/dL (ref 8.9–10.3)
Chloride: 102 mmol/L (ref 98–111)
Creatinine, Ser: 0.61 mg/dL (ref 0.44–1.00)
GFR, Estimated: >60 mL/min (ref >60)
Glucose, Bld: 82 mg/dL (ref 70–99)
Potassium: 3.4 mmol/L — ABNORMAL LOW (ref 3.5–5.1)
Sodium: 134 mmol/L — ABNORMAL LOW (ref 135–145)
Total Bilirubin: 0.7 mg/dL (ref 0.3–1.2)
Total Protein: 8.3 g/dL — ABNORMAL HIGH (ref 6.5–8.1)

## 2021-07-15 LAB — URINALYSIS, MICROSCOPIC (REFLEX)

## 2021-07-15 LAB — PREGNANCY, URINE: Preg Test, Ur: POSITIVE — AB

## 2021-07-15 LAB — HCG, QUANTITATIVE, PREGNANCY: hCG, Beta Chain, Quant, S: 93425 m[IU]/mL — ABNORMAL HIGH (ref <5)

## 2021-07-15 LAB — URINALYSIS, ROUTINE W REFLEX MICROSCOPIC
Bilirubin Urine: NEGATIVE
Glucose, UA: NEGATIVE mg/dL
Hgb urine dipstick: NEGATIVE
Ketones, ur: NEGATIVE mg/dL
Nitrite: NEGATIVE
Protein, ur: NEGATIVE mg/dL
Specific Gravity, Urine: 1.02 (ref 1.005–1.030)
pH: 7 (ref 5.0–8.0)

## 2021-07-15 MED ORDER — METRONIDAZOLE 500 MG PO TABS
2000.0000 mg | ORAL_TABLET | Freq: Once | ORAL | Status: AC
  Administered 2021-07-15: 2000 mg via ORAL
  Filled 2021-07-15: qty 4

## 2021-07-15 MED ORDER — CEPHALEXIN 500 MG PO CAPS: 500.0000 mg | ORAL_CAPSULE | Freq: Three times a day (TID) | ORAL | 0 refills | Status: AC

## 2021-07-15 MED ORDER — ACETAMINOPHEN 325 MG PO TABS
650.0000 mg | ORAL_TABLET | Freq: Once | ORAL | Status: AC
  Administered 2021-07-15: 650 mg via ORAL
  Filled 2021-07-15: qty 2

## 2021-07-15 NOTE — Discharge Instructions (Signed)
Please follow up with your OBGYN for further evaluation given you are currently 7 weeks and 3 days pregnant.   Pick up antibiotics and take as prescribed for a urinary tract infection. You should have your urine rechecked in 1-2 weeks to ensure you have cleared the infection.   You also tested positive for trichomonas at this time. We have treated you with medication in the ED. You will need to let all partners know to be tested and treated as well.   Take Tylenol as needed for pain  Return to the ED for any new/worsening symptoms

## 2021-07-15 NOTE — ED Triage Notes (Signed)
Pain in the right side of her neck and abdomen x 2 hours. Pt is ambulatory.

## 2021-07-15 NOTE — ED Provider Notes (Signed)
[facility] EMERGENCY DEPARTMENT Provider Note   CSN: [id number] Arrival date & time: 07/15/21  1744     History  Chief Complaint  Patient presents with   Abdominal Pain   Neck Pain    Andrea Preston is a 31 y.o. female who presents to the ED today with complaint of gradual onset, constant, achy, right sided neck pain and right sided abdominal pain that began about 2 hours PTA. Pt denies any falls or trauma. She reports she was driving when her pain started. She states that she had similar abdominal/"side" pain a couple of days ago while turning over in bed however it went away and she did not think much of it. She states that the neck pain is present with sneezing, coughing, deep breathing, and certain movements. She has not taken anything for pain. She denies nausea, vomiting, diarrhea, vaginal discharge. She mentions she missed her period last month. She has been pregnant twice before.   The history is provided by the patient and medical records.      Home Medications Prior to Admission medications   Medication Sig Start Date End Date Taking? Authorizing Provider  dicyclomine (BENTYL) 20 MG tablet Take by mouth. 10/20/17   [provider]  diphenhydrAMINE (BENADRYL) 25 MG tablet Take 1 tablet (25 mg total) by mouth every 6 (six) hours as needed. 03/17/21   Arby Barrette, MD  ibuprofen (ADVIL) 800 MG tablet Take 1 tablet (800 mg total) by mouth 3 (three) times daily. 03/17/21   Arby Barrette, MD  methocarbamol (ROBAXIN) 500 MG tablet Take 1 tablet (500 mg total) by mouth 2 (two) times daily. 12/29/19   Mannie Stabile, PA-C  metoCLOPramide (REGLAN) 10 MG tablet Take 1 tablet (10 mg total) by mouth every 6 (six) hours. 03/17/21   Arby Barrette, MD  metroNIDAZOLE (FLAGYL) 500 MG tablet Take 500 mg by mouth 2 (two) times daily. 05/02/20   [provider]  metroNIDAZOLE (METROGEL) 0.75 % vaginal gel Place vaginally at bedtime. 07/28/20   [provider]      Allergies    Patient has no known allergies.    Review of Systems   Review of Systems  Constitutional:  Negative for chills and fever.  Respiratory:  Negative for shortness of breath.   Cardiovascular:  Negative for chest pain.  Gastrointestinal:  Positive for abdominal pain. Negative for constipation, diarrhea, nausea and vomiting.  Genitourinary:  Positive for menstrual problem. Negative for vaginal discharge.  Musculoskeletal:  Positive for neck pain.  All other systems reviewed and are negative.  Physical Exam Updated Vital Signs BP (!) 149/93 (BP Location: Right Arm)    Pulse 92    Temp 98.6 F (37 C) (Oral)    Resp 18    Ht 5\' 2"  (1.575 m)    Wt 88.5 kg    SpO2 100%    BMI 35.69 kg/m  Physical Exam Vitals and nursing note reviewed.  Constitutional:      Appearance: She is not ill-appearing or diaphoretic.  HENT:     Head: Normocephalic and atraumatic.  Eyes:     Conjunctiva/sclera: Conjunctivae normal.  Neck:     Comments: No midline C spine TTP. No TTP to right paracervical/trapezius TTP. ROM intact to neck. Moving all extremities without difficulty.  Cardiovascular:     Rate and Rhythm: Normal rate and regular rhythm.     Heart sounds: Normal heart sounds.  Pulmonary:     Effort: Pulmonary effort is normal.  Breath sounds: Normal breath sounds. No wheezing, rhonchi or rales.  Abdominal:     General: Abdomen is flat.     Palpations: Abdomen is soft.     Tenderness: There is abdominal tenderness.     Comments: + mid right abdominal TTP without rebound or guarding. No CVA TTP. No suprapubic abd TTP.   Musculoskeletal:     Cervical back: Neck supple.  Skin:    General: Skin is warm and dry.  Neurological:     Mental Status: She is alert.    ED Results / Procedures / Treatments   Labs (all labs ordered are listed, but only abnormal results are displayed) Labs Reviewed  PREGNANCY, URINE - Abnormal; Notable for the following components:      Result  Value   Preg Test, Ur POSITIVE (*)    All other components within normal limits  URINALYSIS, ROUTINE W REFLEX MICROSCOPIC - Abnormal; Notable for the following components:   Leukocytes,Ua SMALL (*)    All other components within normal limits  URINALYSIS, MICROSCOPIC (REFLEX) - Abnormal; Notable for the following components:   Bacteria, UA RARE (*)    Trichomonas, UA PRESENT (*)    All other components within normal limits  HCG, QUANTITATIVE, PREGNANCY    EKG None  Radiology No results found.  Procedures Procedures    Medications Ordered in ED Medications - No data to display  ED Course/ Medical Decision Making/ A&P                           Medical Decision Making 31 year old female who presents to the ED today with complaint of atraumatic right-sided neck pain and right-sided abdominal pain that began about 2 hours prior to arrival.  On arrival to the ED vitals are stable.  Patient afebrile, nontachycardic and nontachypneic.  She had a urinalysis and UPT collected while in the waiting room which has returned positive for pregnancy test at this time.  She states her last normal menstrual cycle was sometime in mid December.  She denies any vaginal discharge or pelvic pain specifically.  On my exam she has no midline or paracervical/trapezius muscular tenderness palpation to the neck.  She states the pain is exacerbated with sneezing, coughing, certain movements.  Is neurovascularly intact to her upper extremities.  No concerning findings including headache or dizziness.  Suspect musculoskeletal type pain.  Will treat with Tylenol and reevaluate.  Patient with very mild right-sided abdominal tenderness palpation without obvious CVA tenderness.  Urinalysis with small leuks, 6-10 white blood cells, rare bacteria.  Will treat for urinary tract infection at this time.  Incidentally she also has  trichomonas on urinalysis.  Question of this could be causing some pain.  Per chart review she  was evaluated for trichomonas in September and treated with MetroGel.  Question if she was not treated adequately.  Unfortunately there is no other alternative to oral Flagyl at this time.  Will provide 2 g p.o. Flagyl for trichomonas.  We will proceed with pelvic ultrasound for further evaluation of abdominal pain and positive pregnancy in adult quant at this time.  She denies any vaginal spotting.  Do not feel she requires ABO Rh at this time.  She will need to follow-up with OB/GYN otherwise.  Given she is without complaint of vaginal discharge or pelvic pain we will hold off on pelvic exam currently.   Workup overall reassuring. Pt currently 7 weeks and 3 days pregnant with  IUP appreciated on ultrasound. On reevaluation she reports improvement in neck pain after Tylenol. Will discharge home at this time and treat for asymptomatic bacteruria. Pt instructed to follow up with OBGYN for further evaluation and repeat urinalysis in 1-2 weeks. She is in agreement with plan and stable for discharge home.   Problems Addressed: Asymptomatic bacteriuria during pregnancy: acute illness or injury Less than [redacted] weeks gestation of pregnancy: acute illness or injury Neck pain: acute illness or injury Trichomonas infection: acute illness or injury  Amount and/or Complexity of Data Reviewed Labs: ordered.    Details: CBC without leukocytosis. Hgb stable at 14.5 CMP with potassium 3.4 and sodium 134. No other electrolyte abnormalities  hcg quant 93,425 Radiology: ordered.    Details: Pelvic ultrasound: IMPRESSION:  Single intrauterine gestation with cardiac activity, measuring 7  weeks 3 days by crown-rump length, as above.  Risk OTC drugs. Prescription drug management.           Final Clinical Impression(s) / ED Diagnoses Final diagnoses:  Pain    Rx / DC Orders ED Discharge Orders     None        Discharge Instructions      Please follow up with your OBGYN for further evaluation  given you are currently 7 weeks and 3 days pregnant.   Pick up antibiotics and take as prescribed for a urinary tract infection. You should have your urine rechecked in 1-2 weeks to ensure you have cleared the infection.   You also tested positive for trichomonas at this time. We have treated you with medication in the ED. You will need to let all partners know to be tested and treated as well.   Take Tylenol as needed for pain  Return to the ED for any new/worsening symptoms        Tanda Rockers, PA-C 07/15/21 2224    Derwood Kaplan, MD 07/17/21 1427

## 2022-02-20 ENCOUNTER — Encounter (HOSPITAL_BASED_OUTPATIENT_CLINIC_OR_DEPARTMENT_OTHER): Payer: Self-pay | Admitting: Emergency Medicine

## 2022-02-20 ENCOUNTER — Emergency Department (HOSPITAL_BASED_OUTPATIENT_CLINIC_OR_DEPARTMENT_OTHER)
Admission: EM | Admit: 2022-02-20 | Discharge: 2022-02-20 | Disposition: A | Discharge status: Home / Self Care | Payer: Medicaid Other | Attending: Emergency Medicine | Admitting: Emergency Medicine

## 2022-02-20 ENCOUNTER — Other Ambulatory Visit: Payer: Self-pay

## 2022-02-20 ENCOUNTER — Emergency Department (HOSPITAL_BASED_OUTPATIENT_CLINIC_OR_DEPARTMENT_OTHER): Payer: Medicaid Other

## 2022-02-20 DIAGNOSIS — M25531 Pain in right wrist: Secondary | ICD-10-CM | POA: Insufficient documentation

## 2022-02-20 DIAGNOSIS — J45909 Unspecified asthma, uncomplicated: Secondary | ICD-10-CM | POA: Insufficient documentation

## 2022-02-20 MED ORDER — ACETAMINOPHEN 500 MG PO TABS
1000.0000 mg | ORAL_TABLET | Freq: Once | ORAL | Status: AC
  Administered 2022-02-20: 1000 mg via ORAL
  Filled 2022-02-20: qty 2

## 2022-02-20 NOTE — ED Provider Notes (Signed)
[facility] EMERGENCY DEPARTMENT Provider Note  CSN: [id number] Arrival date & time: 02/20/22 1806  Chief Complaint(s) Wrist Pain  HPI Andrea Preston is a 31 y.o. female presenting to the emergency department with right wrist pain.  The patient reports that around 2 weeks ago she was admitted for pregnancy, had an IV placed in that area.  Since then she has had persistent right wrist pain.  Denies any fevers or chills.  Denies any redness or rashes.  Denies any numbness or tingling.  Has taken Tylenol which helps but the pain comes back.   Past Medical History Past Medical History:  Diagnosis Date   Asthma    There are no problems to display for this patient.  Home Medication(s) Prior to Admission medications   Medication Sig Start Date End Date Taking? Authorizing Provider  dicyclomine (BENTYL) 20 MG tablet Take by mouth. 10/20/17   [provider]  diphenhydrAMINE (BENADRYL) 25 MG tablet Take 1 tablet (25 mg total) by mouth every 6 (six) hours as needed. 03/17/21   Arby Barrette, MD  ibuprofen (ADVIL) 800 MG tablet Take 1 tablet (800 mg total) by mouth 3 (three) times daily. 03/17/21   Arby Barrette, MD  methocarbamol (ROBAXIN) 500 MG tablet Take 1 tablet (500 mg total) by mouth 2 (two) times daily. 12/29/19   Mannie Stabile, PA-C  metoCLOPramide (REGLAN) 10 MG tablet Take 1 tablet (10 mg total) by mouth every 6 (six) hours. 03/17/21   Arby Barrette, MD  metroNIDAZOLE (FLAGYL) 500 MG tablet Take 500 mg by mouth 2 (two) times daily. 05/02/20   [provider]  metroNIDAZOLE (METROGEL) 0.75 % vaginal gel Place vaginally at bedtime. 07/28/20   [provider]                                                                                                                                    Past Surgical History Past Surgical History:  Procedure Laterality Date   HERNIA REPAIR     Family History History reviewed. No pertinent family  history.  Social History Social History   Tobacco Use   Smoking status: Never   Smokeless tobacco: Never  Vaping Use   Vaping Use: Never used  Substance Use Topics   Alcohol use: Not Currently    Comment: occ   Drug use: Not Currently   Allergies Patient has no known allergies.  Review of Systems Review of Systems  All other systems reviewed and are negative.   Physical Exam Vital Signs  I have reviewed the triage vital signs BP 138/79 (BP Location: Left Arm)   Pulse 95   Temp 98.6 F (37 C) (Oral)   Resp 16   Ht 5\' 2"  (1.575 m)   Wt 92.5 kg   SpO2 99%   Breastfeeding Yes   BMI 37.31 kg/m  Physical Exam Vitals and nursing note reviewed.  Constitutional:  Appearance: Normal appearance.  HENT:     Head: Normocephalic and atraumatic.     Mouth/Throat:     Mouth: Mucous membranes are moist.  Eyes:     Conjunctiva/sclera: Conjunctivae normal.  Cardiovascular:     Rate and Rhythm: Normal rate.  Pulmonary:     Effort: Pulmonary effort is normal. No respiratory distress.  Abdominal:     General: Abdomen is flat.  Musculoskeletal:        General: No deformity.     Comments: Full range of motion of the right wrist, no significant tenderness, 2+ radial pulses with intact capillary refill, no soft tissue swelling, no redness or warmth  Skin:    General: Skin is warm and dry.     Capillary Refill: Capillary refill takes less than 2 seconds.  Neurological:     General: No focal deficit present.     Mental Status: She is alert. Mental status is at baseline.  Psychiatric:        Mood and Affect: Mood normal.        Behavior: Behavior normal.     ED Results and Treatments Labs (all labs ordered are listed, but only abnormal results are displayed) Labs Reviewed - No data to display                                                                                                                        Radiology DG Wrist Complete Right  Result Date:  02/20/2022 CLINICAL DATA:  Wrist pain with swelling. EXAM: RIGHT WRIST - COMPLETE 3+ VIEW COMPARISON:  None Available. FINDINGS: There is no evidence of fracture or dislocation. There is no evidence of arthropathy or other focal bone abnormality. Soft tissues are unremarkable. IMPRESSION: Negative. Electronically Signed   By: Darliss Cheney M.D.   On: 02/20/2022 18:49    Pertinent labs & imaging results that were available during my care of the patient were reviewed by me and considered in my medical decision making (see MDM for details).  Medications Ordered in ED Medications  acetaminophen (TYLENOL) tablet 1,000 mg (has no administration in time range)                                                                                                                                     Procedures Procedures  (including critical care time)  Medical Decision Making / ED Course   MDM:  31 year old female presenting to the emergency department for wrist pain after IV placement 2 weeks ago.  Physical exam with no obvious swelling, no signs of infection, low concern for thrombosis, could represent phlebitis although no overlying skin changes.  Doubt septic joint.  X-ray negative for fracture.  Recommended continue Tylenol, ice packs.  Patient breast-feeding.  Advise follow-up with primary physician and if symptoms persistent referral to hand surgery.      Additional history obtained: -External records from outside source obtained and reviewed including: Chart review including previous notes, labs, imaging, consultation notes   Lab Tests: -I ordered, reviewed, and interpreted labs.   The pertinent results include:   Labs Reviewed - No data to display     Imaging Studies ordered: I ordered imaging studies including XR wrist On my interpretation imaging demonstrates no fracture I independently visualized and interpreted imaging. I agree with the radiologist interpretation   Medicines  ordered and prescription drug management: Meds ordered this encounter  Medications   acetaminophen (TYLENOL) tablet 1,000 mg    -I have reviewed the patients home medicines and have made adjustments as needed    Reevaluation: After the interventions noted above, I reevaluated the patient and found that they have improved  Co morbidities that complicate the patient evaluation  Past Medical History:  Diagnosis Date   Asthma       Dispostion: Discharge    Final Clinical Impression(s) / ED Diagnoses Final diagnoses:  Right wrist pain     This chart was dictated using voice recognition software.  Despite best efforts to proofread,  errors can occur which can change the documentation meaning.    Lonell Grandchild, MD 02/20/22 2052

## 2022-02-20 NOTE — ED Notes (Signed)
Pt d/c home per MD order. Discharge summary reviewed with pt, pt verbalizes understanding. Ambulatory off unit. No s/s of acute distress noted at discharge.  °

## 2022-02-20 NOTE — ED Triage Notes (Signed)
Pt c/o right wrist pain since 9/1. States she had an IV in this area. IV was removed and patient has had pain in this area since. Endorses swelling and bruising.

## 2022-02-20 NOTE — Discharge Instructions (Addendum)
We evaluated you for your wrist pain after IV placement.  You did not have signs of a fracture, infection, or blood clot.  Sometimes the vein can get irritated from an IV and this can cause discomfort.  Please continue to take Tylenol, you can take Tylenol 1000 mg every 6 hours while you continue to have pain.  Please follow-up with your primary physician.

## 2022-03-14 ENCOUNTER — Emergency Department (HOSPITAL_BASED_OUTPATIENT_CLINIC_OR_DEPARTMENT_OTHER): Payer: Medicaid Other

## 2022-03-14 ENCOUNTER — Inpatient Hospital Stay (HOSPITAL_BASED_OUTPATIENT_CLINIC_OR_DEPARTMENT_OTHER)
Admission: EM | Admit: 2022-03-14 | Discharge: 2022-03-19 | DRG: 776 | Disposition: A | Discharge status: Home / Self Care | Payer: Medicaid Other | Attending: Internal Medicine | Admitting: Internal Medicine

## 2022-03-14 ENCOUNTER — Other Ambulatory Visit: Payer: Self-pay

## 2022-03-14 ENCOUNTER — Encounter (HOSPITAL_BASED_OUTPATIENT_CLINIC_OR_DEPARTMENT_OTHER): Payer: Self-pay | Admitting: Pediatrics

## 2022-03-14 DIAGNOSIS — I2699 Other pulmonary embolism without acute cor pulmonale: Principal | ICD-10-CM

## 2022-03-14 DIAGNOSIS — J45909 Unspecified asthma, uncomplicated: Secondary | ICD-10-CM | POA: Diagnosis present

## 2022-03-14 DIAGNOSIS — D649 Anemia, unspecified: Secondary | ICD-10-CM | POA: Diagnosis present

## 2022-03-14 DIAGNOSIS — E871 Hypo-osmolality and hyponatremia: Secondary | ICD-10-CM | POA: Diagnosis not present

## 2022-03-14 DIAGNOSIS — Z6837 Body mass index (BMI) 37.0-37.9, adult: Secondary | ICD-10-CM

## 2022-03-14 DIAGNOSIS — Z79899 Other long term (current) drug therapy: Secondary | ICD-10-CM

## 2022-03-14 DIAGNOSIS — E041 Nontoxic single thyroid nodule: Secondary | ICD-10-CM | POA: Diagnosis present

## 2022-03-14 DIAGNOSIS — O9953 Diseases of the respiratory system complicating the puerperium: Secondary | ICD-10-CM | POA: Diagnosis present

## 2022-03-14 DIAGNOSIS — O9081 Anemia of the puerperium: Secondary | ICD-10-CM | POA: Diagnosis present

## 2022-03-14 DIAGNOSIS — I2609 Other pulmonary embolism with acute cor pulmonale: Secondary | ICD-10-CM | POA: Diagnosis present

## 2022-03-14 DIAGNOSIS — O8823 Thromboembolism in the puerperium: Principal | ICD-10-CM | POA: Diagnosis present

## 2022-03-14 DIAGNOSIS — E6609 Other obesity due to excess calories: Secondary | ICD-10-CM | POA: Diagnosis present

## 2022-03-14 DIAGNOSIS — E876 Hypokalemia: Secondary | ICD-10-CM | POA: Diagnosis not present

## 2022-03-14 LAB — PROTIME-INR
INR: 1.1 (ref 0.8–1.2)
Prothrombin Time: 14.3 seconds (ref 11.4–15.2)

## 2022-03-14 LAB — BRAIN NATRIURETIC PEPTIDE: B Natriuretic Peptide: 13.1 pg/mL (ref 0.0–100.0)

## 2022-03-14 LAB — COMPREHENSIVE METABOLIC PANEL
ALT: 20 U/L (ref 0–44)
AST: 26 U/L (ref 15–41)
Albumin: 4 g/dL (ref 3.5–5.0)
Alkaline Phosphatase: 99 U/L (ref 38–126)
Anion gap: 10 (ref 5–15)
BUN: 6 mg/dL (ref 6–20)
CO2: 26 mmol/L (ref 22–32)
Calcium: 9.2 mg/dL (ref 8.9–10.3)
Chloride: 101 mmol/L (ref 98–111)
Creatinine, Ser: 0.91 mg/dL (ref 0.44–1.00)
GFR, Estimated: >60 mL/min (ref >60)
Glucose, Bld: 83 mg/dL (ref 70–99)
Potassium: 3.6 mmol/L (ref 3.5–5.1)
Sodium: 137 mmol/L (ref 135–145)
Total Bilirubin: 0.7 mg/dL (ref 0.3–1.2)
Total Protein: 9.1 g/dL — ABNORMAL HIGH (ref 6.5–8.1)

## 2022-03-14 LAB — CBC
HCT: 41.3 % (ref 36.0–46.0)
Hemoglobin: 13.3 g/dL (ref 12.0–15.0)
MCH: 23.8 pg — ABNORMAL LOW (ref 26.0–34.0)
MCHC: 32.2 g/dL (ref 30.0–36.0)
MCV: 74 fL — ABNORMAL LOW (ref 80.0–100.0)
Platelets: 326 10*3/uL (ref 150–400)
RBC: 5.58 MIL/uL — ABNORMAL HIGH (ref 3.87–5.11)
RDW: 16.5 % — ABNORMAL HIGH (ref 11.5–15.5)
WBC: 8.4 10*3/uL (ref 4.0–10.5)
nRBC: 0 % (ref 0.0–0.2)

## 2022-03-14 LAB — TROPONIN I (HIGH SENSITIVITY): Troponin I (High Sensitivity): <2 ng/L (ref <18)

## 2022-03-14 LAB — LIPASE, BLOOD: Lipase: 27 U/L (ref 11–51)

## 2022-03-14 MED ORDER — HEPARIN BOLUS VIA INFUSION
5000.0000 [IU] | Freq: Once | INTRAVENOUS | Status: AC
  Administered 2022-03-14: 5000 [IU] via INTRAVENOUS

## 2022-03-14 MED ORDER — FENTANYL CITRATE PF 50 MCG/ML IJ SOSY
50.0000 ug | PREFILLED_SYRINGE | Freq: Once | INTRAMUSCULAR | Status: AC
  Administered 2022-03-14: 50 ug via INTRAVENOUS
  Filled 2022-03-14: qty 1

## 2022-03-14 MED ORDER — MORPHINE SULFATE (PF) 4 MG/ML IV SOLN
4.0000 mg | Freq: Once | INTRAVENOUS | Status: AC
  Administered 2022-03-14: 4 mg via INTRAVENOUS
  Filled 2022-03-14: qty 1

## 2022-03-14 MED ORDER — HEPARIN (PORCINE) 25000 UT/250ML-% IV SOLN
1200.0000 [IU]/h | INTRAVENOUS | Status: DC
  Administered 2022-03-14: 1300 [IU]/h via INTRAVENOUS
  Administered 2022-03-15: 1200 [IU]/h via INTRAVENOUS
  Filled 2022-03-14 (×2): qty 250

## 2022-03-14 MED ORDER — IOHEXOL 350 MG/ML SOLN
100.0000 mL | Freq: Once | INTRAVENOUS | Status: AC | PRN
  Administered 2022-03-14: 100 mL via INTRAVENOUS

## 2022-03-14 MED ORDER — SODIUM CHLORIDE 0.9 % IV BOLUS
1000.0000 mL | Freq: Once | INTRAVENOUS | Status: AC
  Administered 2022-03-14: 1000 mL via INTRAVENOUS

## 2022-03-14 NOTE — ED Notes (Signed)
Patient complains of urinary retention . Pt bladder scanned for 703JK of urine , MD Roxanne Mins made aware . Foley catheter inserted as ordered .

## 2022-03-14 NOTE — ED Triage Notes (Signed)
C/O neck pain started Saturday; along w/ chest pain and shortness of breath  even at rest that started yesterday. Stated it hurts to move around. Recent vaginal delivery 9/2

## 2022-03-14 NOTE — ED Notes (Signed)
Received patient in room 3 on cardiac monitor, breathing on room air saturating at 97%. Patient medicated for pain , Labs drawn . Patient weighed . Pending Heparin orders. Continuing monitoring.

## 2022-03-14 NOTE — ED Notes (Addendum)
Presents with c/o chest pain, that is in the area of epigastric to throat, rates pain a 10 on 0-10 scale, now also has neck pain. Noted to be tachycardiac on monitor without ectopy. States due to pain has been unable to eat for the past 2 days. Client states she had her 3rd child 1 month ago, normal vaginal delivery, uncomplicated pregnancy term and delivery per pt statement

## 2022-03-14 NOTE — ED Provider Notes (Signed)
[facility] EMERGENCY DEPARTMENT Provider Note   CSN: [id number] Arrival date & time: 03/14/22  1500     History  Chief Complaint  Patient presents with   Shortness of Breath   Chest Pain    Andrea Preston is a 31 y.o. female.  The history is provided by the patient and medical records. No language interpreter was used.  Shortness of Breath Severity:  Severe Onset quality:  Gradual Duration:  3 days Timing:  Constant Progression:  Waxing and waning Chronicity:  New Context: not URI   Relieved by:  Nothing Worsened by:  Deep breathing Ineffective treatments:  None tried Associated symptoms: abdominal pain (mainly chest but very mild discomfort in upper epigastric atrea), chest pain and neck pain   Associated symptoms: no cough, no diaphoresis, no fever, no headaches, no hemoptysis, no rash, no sputum production, no syncope, no vomiting and no wheezing   Chest Pain Pain location:  Substernal area Pain quality: aching, pressure, radiating and sharp   Pain radiates to:  Neck and epigastrium Pain severity:  Severe Onset quality:  Gradual Duration:  3 days Timing:  Constant Progression:  Waxing and waning Chronicity:  New Relieved by:  Nothing Worsened by:  Deep breathing Ineffective treatments:  None tried Associated symptoms: abdominal pain (mainly chest but very mild discomfort in upper epigastric atrea) and shortness of breath   Associated symptoms: no back pain, no cough, no diaphoresis, no dizziness, no fatigue, no fever, no headache, no lower extremity edema, no nausea, no near-syncope, no numbness, no palpitations, no syncope, no vomiting and no weakness   Risk factors: no coronary artery disease and not pregnant (recent delivery last month)        Home Medications Prior to Admission medications   Medication Sig Start Date End Date Taking? Authorizing Provider  dicyclomine (BENTYL) 20 MG tablet Take by mouth. 10/20/17   [provider]   diphenhydrAMINE (BENADRYL) 25 MG tablet Take 1 tablet (25 mg total) by mouth every 6 (six) hours as needed. 03/17/21   Arby BarrettePfeiffer, Marcy, MD  ibuprofen (ADVIL) 800 MG tablet Take 1 tablet (800 mg total) by mouth 3 (three) times daily. 03/17/21   Arby BarrettePfeiffer, Marcy, MD  methocarbamol (ROBAXIN) 500 MG tablet Take 1 tablet (500 mg total) by mouth 2 (two) times daily. 12/29/19   Mannie StabileAberman, Caroline C, PA-C  metoCLOPramide (REGLAN) 10 MG tablet Take 1 tablet (10 mg total) by mouth every 6 (six) hours. 03/17/21   Arby BarrettePfeiffer, Marcy, MD  metroNIDAZOLE (FLAGYL) 500 MG tablet Take 500 mg by mouth 2 (two) times daily. 05/02/20   [provider]  metroNIDAZOLE (METROGEL) 0.75 % vaginal gel Place vaginally at bedtime. 07/28/20   [provider]      Allergies    Patient has no known allergies.    Review of Systems   Review of Systems  Constitutional:  Negative for chills, diaphoresis, fatigue and fever.  HENT:  Negative for congestion.   Respiratory:  Positive for chest tightness and shortness of breath. Negative for cough, hemoptysis, sputum production, wheezing and stridor.   Cardiovascular:  Positive for chest pain. Negative for palpitations, syncope and near-syncope.  Gastrointestinal:  Positive for abdominal pain (mainly chest but very mild discomfort in upper epigastric atrea). Negative for constipation, diarrhea, nausea and vomiting.  Genitourinary:  Negative for dysuria, flank pain and frequency.  Musculoskeletal:  Positive for neck pain. Negative for back pain and neck stiffness.  Skin:  Negative for rash and wound.  Neurological:  Negative for dizziness, weakness, light-headedness, numbness and headaches.  Psychiatric/Behavioral:  Negative for agitation and confusion.   All other systems reviewed and are negative.   Physical Exam Updated Vital Signs BP (!) 140/81   Pulse (!) 110   Temp 98.7 F (37.1 C) (Oral)   Resp 20   Ht 5\' 2"  (1.575 m)   SpO2 93%   BMI 37.31 kg/m  Physical  Exam Vitals and nursing note reviewed.  Constitutional:      General: She is not in acute distress.    Appearance: She is well-developed. She is not ill-appearing, toxic-appearing or diaphoretic.  HENT:     Head: Normocephalic and atraumatic.     Nose: No congestion or rhinorrhea.     Mouth/Throat:     Mouth: Mucous membranes are dry.     Pharynx: No oropharyngeal exudate or posterior oropharyngeal erythema.  Eyes:     Extraocular Movements: Extraocular movements intact.     Conjunctiva/sclera: Conjunctivae normal.     Pupils: Pupils are equal, round, and reactive to light.  Cardiovascular:     Rate and Rhythm: Regular rhythm. Tachycardia present.     Heart sounds: No murmur heard. Pulmonary:     Effort: Tachypnea present. No respiratory distress.     Breath sounds: Normal breath sounds. No decreased breath sounds, wheezing, rhonchi or rales.  Chest:     Chest wall: No tenderness.  Abdominal:     Palpations: Abdomen is soft.     Tenderness: There is no abdominal tenderness. There is no right CVA tenderness, guarding or rebound.  Musculoskeletal:        General: No swelling or signs of injury.     Cervical back: Neck supple. No tenderness.     Right lower leg: No edema.     Left lower leg: No edema.  Skin:    General: Skin is warm and dry.     Capillary Refill: Capillary refill takes less than 2 seconds.     Findings: No erythema or rash.  Neurological:     General: No focal deficit present.     Mental Status: She is alert.  Psychiatric:        Mood and Affect: Mood normal.     ED Results / Procedures / Treatments   Labs (all labs ordered are listed, but only abnormal results are displayed) Labs Reviewed  CBC - Abnormal; Notable for the following components:      Result Value   RBC 5.58 (*)    MCV 74.0 (*)    MCH 23.8 (*)    RDW 16.5 (*)    All other components within normal limits  COMPREHENSIVE METABOLIC PANEL - Abnormal; Notable for the following components:    Total Protein 9.1 (*)    All other components within normal limits  LIPASE, BLOOD  BRAIN NATRIURETIC PEPTIDE  PROTIME-INR  PREGNANCY, URINE  LACTIC ACID, PLASMA  LACTIC ACID, PLASMA  TSH  T3, FREE  T4, FREE  HEPARIN LEVEL (UNFRACTIONATED)  TROPONIN I (HIGH SENSITIVITY)  TROPONIN I (HIGH SENSITIVITY)    EKG EKG Interpretation  Date/Time:  Monday March 14 2022 15:17:57 EDT Ventricular Rate:  110 PR Interval:  148 QRS Duration: 76 QT Interval:  344 QTC Calculation: 465 R Axis:   14 Text Interpretation: Sinus tachycardia Nonspecific T wave abnormality Abnormal ECG When compared with ECG of 04-Apr-2021 09:52, PREVIOUS ECG IS PRESENT when compared to prior, more pronounced t wave inversionin lead 3. No STEMI Confirmed by Ramonte Mena, 06-Apr-2021 (  [id number]) on 03/14/2022 4:27:47 PM  Radiology CT Angio Chest PE W and/or Wo Contrast  Result Date: 03/14/2022 CLINICAL DATA:  Chest pain EXAM: CT ANGIOGRAPHY CHEST WITH CONTRAST TECHNIQUE: Multidetector CT imaging of the chest was performed using the standard protocol during bolus administration of intravenous contrast. Multiplanar CT image reconstructions and MIPs were obtained to evaluate the vascular anatomy. RADIATION DOSE REDUCTION: This exam was performed according to the departmental dose-optimization program which includes automated exposure control, adjustment of the mA and/or kV according to patient size and/or use of iterative reconstruction technique. CONTRAST:  OMNIPAQUE IOHEXOL 350 MG/ML SOLN COMPARISON:  None Available. FINDINGS: Cardiovascular: Contrast injection is sufficient to demonstrate satisfactory opacification of the pulmonary arteries to the segmental level. Acute pulmonary embolus with majority of the clot burden in the right lower lobar and proximal segmental arteries. There is evidence of right heart strain with the calculated RV/LV ratio of 1.07. small amount of clot in the lingular branches. The size of the main pulmonary  artery is normal. Heart size is normal, with no pericardial effusion. The course and caliber of the aorta are normal. There is no atherosclerotic calcification. Opacification decreased due to pulmonary arterial phase contrast bolus timing. Mediastinum/Nodes: No mediastinal, hilar or axillary lymphadenopathy. Normal visualized thyroid. Thoracic esophageal course is normal. Lungs/Pleura: Airways are patent. No pleural effusion, lobar consolidation, pneumothorax or pulmonary infarction. Upper Abdomen: Contrast bolus timing is not optimized for evaluation of the abdominal organs. The visualized portions of the organs of the upper abdomen are normal. Musculoskeletal: No chest wall abnormality. No bony spinal canal stenosis. Review of the MIP images confirms the above findings. IMPRESSION: 1. Acute pulmonary embolus with majority of the clot burden in the right lower lobar and proximal segmental arteries. Small amount of clot in the lingula branches. 2. CT findings of right heart strain (RV/LV Ratio = 1.07 ) consistent with at least submassive (intermediate risk) PE. The presence of right heart strain has been associated with an increased risk of morbidity and mortality. Critical Value/emergent results were called by telephone at the time of interpretation on 03/14/2022 at 7:03 pm to provider [facility] , who verbally acknowledged these results. Electronically Signed   By: Deatra Robinson M.D.   On: 03/14/2022 19:24   CT Soft Tissue Neck W Contrast  Result Date: 03/14/2022 CLINICAL DATA:  Chest pain and shortness of breath EXAM: CT NECK WITH CONTRAST TECHNIQUE: Multidetector CT imaging of the neck was performed using the standard protocol following the bolus administration of intravenous contrast. RADIATION DOSE REDUCTION: This exam was performed according to the departmental dose-optimization program which includes automated exposure control, adjustment of the mA and/or kV according to patient size and/or use  of iterative reconstruction technique. CONTRAST:  OMNIPAQUE IOHEXOL 350 MG/ML SOLN COMPARISON:  None Available. FINDINGS: PHARYNX AND LARYNX: The nasopharynx, oropharynx and larynx are normal. Visible portions of the oral cavity, tongue base and floor of mouth are normal. Normal epiglottis, vallecula and pyriform sinuses. The larynx is normal. No retropharyngeal abscess, effusion or lymphadenopathy. SALIVARY GLANDS: Normal parotid, submandibular and sublingual glands. THYROID: Heterogeneous and enlarged thyroid gland, left predominant with 1.8 cm left thyroid lobe nodule. LYMPH NODES: No enlarged or abnormal density lymph nodes. VASCULAR: Major cervical vessels are patent. LIMITED INTRACRANIAL: Normal. VISUALIZED ORBITS: Normal. MASTOIDS AND VISUALIZED PARANASAL SINUSES: No fluid levels or advanced mucosal thickening. No mastoid effusion. SKELETON: No bony spinal canal stenosis. No lytic or blastic lesions. UPPER CHEST: Clear. OTHER: None. IMPRESSION: 1. No acute  abnormality of the neck. 2. Incidental heterogeneous and enlarged thyroid with 1.8 cm nodule in the left lobe. Recommend non-emergent thyroid ultrasound. Reference: J Am Coll Radiol. 2015 Feb;12(2): 143-50 Electronically Signed   By: Deatra Robinson M.D.   On: 03/14/2022 19:07   DG Chest 2 View  Result Date: 03/14/2022 CLINICAL DATA:  Chest pain, short of breath EXAM: CHEST - 2 VIEW COMPARISON:  None Available. FINDINGS: Normal mediastinum and cardiac silhouette. Normal pulmonary vasculature. No evidence of effusion, infiltrate, or pneumothorax. No acute bony abnormality. IMPRESSION: No acute cardiopulmonary process. Electronically Signed   By: Genevive Bi M.D.   On: 03/14/2022 15:38    Procedures Procedures    CRITICAL CARE Performed by: Canary Brim Cervando Durnin Total critical care time: 35 minutes Critical care time was exclusive of separately billable procedures and treating other patients. Critical care was necessary to treat or  prevent imminent or life-threatening deterioration. Critical care was time spent personally by me on the following activities: development of treatment plan with patient and/or surrogate as well as nursing, discussions with consultants, evaluation of patient's response to treatment, examination of patient, obtaining history from patient or surrogate, ordering and performing treatments and interventions, ordering and review of laboratory studies, ordering and review of radiographic studies, pulse oximetry and re-evaluation of patient's condition.  Medications Ordered in ED Medications  fentaNYL (SUBLIMAZE) injection 50 mcg (has no administration in time range)  sodium chloride 0.9 % bolus 1,000 mL (0 mLs Intravenous Stopped 03/14/22 1903)  morphine (PF) 4 MG/ML injection 4 mg (4 mg Intravenous Given 03/14/22 1637)  iohexol (OMNIPAQUE) 350 MG/ML injection 100 mL (100 mLs Intravenous Contrast Given 03/14/22 1836)    ED Course/ Medical Decision Making/ A&P                           Medical Decision Making Amount and/or Complexity of Data Reviewed Labs: ordered. Radiology: ordered.  Risk Prescription drug management. Decision regarding hospitalization.    Andrea Preston is a 31 y.o. female with a past medical history significant for asthma, previous abdominal hernia repair, and recent vaginal delivery 1 month ago who presents with worsening shortness of breath and pleuritic chest pain going into her left anterior neck and epigastric area.  Patient reports that she had an uncomplicated delivery 1 month ago and since that time has been having has done fairly well.  She reports that 2 days ago, she had onset of pain in her central chest.  She reports it does radiate towards her left neck and slightly into her epigastric area.  She reports no nausea, vomiting, constipation, diarrhea, or urinary changes.  Is denying lower abdominal pain or any vaginal troubles.  Denies any leg pain or leg swelling.  She  reports she has had some right hand pain initially after delivery but that had improved.  She denies any arm swelling either.  She reports no headache or head injury.  She reports she has had less appetite and may be slightly hydrated.  She has done some bottlefeeding and some breast-feeding but reports she can purely bottlefeed if needed.  She reports the pain is up to 10 out of 10 in severity and is currently 9 out of 10.  It is very pleuritic and she cannot take a deep breath.  She denies any history of DVT or PE.  No other history of heart disease.  She is denying any fevers, chills, congestion, or cough.  No URI symptoms  otherwise.  On exam, lungs were clear without significant wheezing or rales or rhonchi.  Chest was nontender and I cannot reduce discomfort.  Left anterior neck was slightly tender to palpation but she had intact sensation, strength, and pulses in extremities.  Legs were nontender and nonedematous.  Arms are nonedematous.  Abdomen was nontender and I cannot reproduce any discomfort.  Normal bowel sounds.  No murmur.  Back was nontender.  Patient is tachycardic and tachypneic.   Given the patient's extreme pleuritic discomfort and shortness of breath with her vital signs I am somewhat concerned she could have a PE or even a thoracic outlet syndrome if there is clot going into her neck.  I personally placed an ultrasound-guided IV for IV access into her right arm.  We will do CT PE study and CT soft tissue neck to look for concerning findings such as large PE or even a thrombus going into the neck.  We will give her some pain medicine as she reports she is bottlefeeding at times.  She will get some fluids as she does appear slightly dehydrated with dry mouth on mucous membrane exam.  She has no critical edema and her lungs do not sound wet so have low suspicion for acute postpartum heart failure this time.  If x-ray shows concern for fluid overload or description of symptoms change, would  consider adding on a BNP.  Anticipate reassessment after work-up to determine disposition.  7:29 PM I personally viewed her CT scan and see evidence of acute pulmonary embolism.  I called radiology who confirmed patient does indeed have evidence of acute pulmonary emboli with evidence of heart strain.  They also initially did not see evidence of clot in her neck but showed evidence of a thyroid nodule some kind.  Due to the heart strain, I just spoke with critical care and they feel that she is appearing stable for a stepdown bed and does not need ICU level care.  They agreed with adding on T3, T4, TSH, and a lactic for the neck and we will call hospitalist for admission for further management.  We will start heparin and patient will be admitted.  Otherwise patient denies rectal bleeding or any vaginal bleeding recently.         Final Clinical Impression(s) / ED Diagnoses Final diagnoses:  Acute pulmonary embolism, unspecified pulmonary embolism type, unspecified whether acute cor pulmonale present (HCC)   Clinical Impression: 1. Acute pulmonary embolism, unspecified pulmonary embolism type, unspecified whether acute cor pulmonale present [place])     Disposition: Admit  This note was prepared with assistance of Dragon voice recognition software. Occasional wrong-word or sound-a-like substitutions may have occurred due to the inherent limitations of voice recognition software.     Ronan Dion, Gwenyth Allegra, MD 03/15/22 0003

## 2022-03-14 NOTE — Progress Notes (Signed)
ANTICOAGULATION CONSULT NOTE - Initial Consult  Pharmacy Consult for Heparin Indication: pulmonary embolus  No Known Allergies  Patient Measurements: Height: 5\' 2"  (157.5 cm) IBW/kg (Calculated) : 50.1 Heparin Dosing Weight: 71.6 kg  Vital Signs: Temp: 99.4 F (37.4 C) (10/02 1903) Temp Source: Oral (10/02 1903) BP: 146/107 (10/02 1903) Pulse Rate: 102 (10/02 1903)  Labs: Recent Labs    03/14/22 1632  HGB 13.3  HCT 41.3  PLT 326  CREATININE 0.91  TROPONINIHS <2    CrCl cannot be calculated (Unknown ideal weight.).   Medical History: Past Medical History:  Diagnosis Date   Asthma     Medications:  (Not in a hospital admission)  Scheduled:  Infusions:  PRN:   Assessment: 31 yof with a history of asthma, abdominal hernia repair, vaginal delivery 1 month ago. Patient is presenting with SOB and pleuritic chest pain. Heparin per pharmacy consult placed for pulmonary embolus.  Patient is not on anticoagulation prior to arrival.  Hgb 13.3; plt 326  Goal of Therapy:  Heparin level 0.3-0.7 units/ml Monitor platelets by anticoagulation protocol: Yes   Plan:  Give IV heparin 5000 units bolus x 1 Start heparin infusion at 1300 units/hr Check anti-Xa level in 6 hours and daily while on heparin Continue to monitor H&H and platelets  Lorelei Pont, PharmD, BCPS 03/14/2022 7:21 PM ED Clinical Pharmacist -  [phone]

## 2022-03-14 NOTE — ED Notes (Signed)
Hand off report provided to night shift RN.

## 2022-03-15 ENCOUNTER — Telehealth (HOSPITAL_COMMUNITY): Payer: Self-pay | Admitting: Pharmacy Technician

## 2022-03-15 ENCOUNTER — Other Ambulatory Visit (HOSPITAL_COMMUNITY): Payer: Self-pay

## 2022-03-15 ENCOUNTER — Encounter (HOSPITAL_COMMUNITY): Payer: Self-pay | Admitting: Emergency Medicine

## 2022-03-15 ENCOUNTER — Inpatient Hospital Stay (HOSPITAL_COMMUNITY): Payer: Medicaid Other

## 2022-03-15 DIAGNOSIS — Z6837 Body mass index (BMI) 37.0-37.9, adult: Secondary | ICD-10-CM

## 2022-03-15 DIAGNOSIS — J45909 Unspecified asthma, uncomplicated: Secondary | ICD-10-CM | POA: Diagnosis present

## 2022-03-15 DIAGNOSIS — O9081 Anemia of the puerperium: Secondary | ICD-10-CM | POA: Diagnosis present

## 2022-03-15 DIAGNOSIS — E6609 Other obesity due to excess calories: Secondary | ICD-10-CM

## 2022-03-15 DIAGNOSIS — D649 Anemia, unspecified: Secondary | ICD-10-CM | POA: Diagnosis not present

## 2022-03-15 DIAGNOSIS — Z79899 Other long term (current) drug therapy: Secondary | ICD-10-CM | POA: Diagnosis not present

## 2022-03-15 DIAGNOSIS — J4520 Mild intermittent asthma, uncomplicated: Secondary | ICD-10-CM | POA: Diagnosis not present

## 2022-03-15 DIAGNOSIS — O8823 Thromboembolism in the puerperium: Secondary | ICD-10-CM | POA: Diagnosis not present

## 2022-03-15 DIAGNOSIS — E041 Nontoxic single thyroid nodule: Secondary | ICD-10-CM | POA: Diagnosis present

## 2022-03-15 DIAGNOSIS — I2609 Other pulmonary embolism with acute cor pulmonale: Secondary | ICD-10-CM | POA: Diagnosis not present

## 2022-03-15 DIAGNOSIS — E876 Hypokalemia: Secondary | ICD-10-CM | POA: Diagnosis not present

## 2022-03-15 DIAGNOSIS — I2699 Other pulmonary embolism without acute cor pulmonale: Secondary | ICD-10-CM | POA: Diagnosis not present

## 2022-03-15 DIAGNOSIS — I2602 Saddle embolus of pulmonary artery with acute cor pulmonale: Secondary | ICD-10-CM | POA: Diagnosis not present

## 2022-03-15 DIAGNOSIS — E871 Hypo-osmolality and hyponatremia: Secondary | ICD-10-CM | POA: Diagnosis not present

## 2022-03-15 DIAGNOSIS — O9953 Diseases of the respiratory system complicating the puerperium: Secondary | ICD-10-CM | POA: Diagnosis present

## 2022-03-15 LAB — LACTIC ACID, PLASMA: Lactic Acid, Venous: 0.7 mmol/L (ref 0.5–1.9)

## 2022-03-15 LAB — ECHOCARDIOGRAM LIMITED
Calc EF: 58 %
Height: 62 in
S' Lateral: 2.7 cm
Single Plane A2C EF: 58 %
Single Plane A4C EF: 58.1 %
Weight: 3269.86 oz

## 2022-03-15 LAB — TSH: TSH: 0.301 u[IU]/mL — ABNORMAL LOW (ref 0.350–4.500)

## 2022-03-15 LAB — TROPONIN I (HIGH SENSITIVITY): Troponin I (High Sensitivity): 3 ng/L

## 2022-03-15 LAB — HEPARIN LEVEL (UNFRACTIONATED)
Heparin Unfractionated: 0.77 IU/mL — ABNORMAL HIGH (ref 0.30–0.70)
Heparin Unfractionated: 0.33 IU/mL (ref 0.30–0.70)

## 2022-03-15 LAB — T4, FREE: Free T4: 0.92 ng/dL (ref 0.61–1.12)

## 2022-03-15 MED ORDER — HYDRALAZINE HCL 20 MG/ML IJ SOLN: 5.0000 mg | INTRAMUSCULAR | Status: DC | PRN

## 2022-03-15 MED ORDER — SODIUM CHLORIDE 0.9% FLUSH
3.0000 mL | Freq: Two times a day (BID) | INTRAVENOUS | Status: DC
  Administered 2022-03-15 – 2022-03-19 (×8): 3 mL via INTRAVENOUS

## 2022-03-15 MED ORDER — ACETAMINOPHEN 650 MG RE SUPP: 650.0000 mg | Freq: Four times a day (QID) | RECTAL | Status: DC | PRN

## 2022-03-15 MED ORDER — ALBUTEROL SULFATE (2.5 MG/3ML) 0.083% IN NEBU: 2.5000 mg | INHALATION_SOLUTION | RESPIRATORY_TRACT | Status: DC | PRN

## 2022-03-15 MED ORDER — APIXABAN 5 MG PO TABS: 5.0000 mg | ORAL_TABLET | Freq: Two times a day (BID) | ORAL | Status: DC

## 2022-03-15 MED ORDER — ACETAMINOPHEN 325 MG PO TABS: 650.0000 mg | ORAL_TABLET | Freq: Four times a day (QID) | ORAL | Status: DC | PRN

## 2022-03-15 MED ORDER — ONDANSETRON HCL 4 MG PO TABS: 4.0000 mg | ORAL_TABLET | Freq: Four times a day (QID) | ORAL | Status: DC | PRN

## 2022-03-15 MED ORDER — OXYCODONE HCL 5 MG PO TABS
5.0000 mg | ORAL_TABLET | ORAL | Status: DC | PRN
  Administered 2022-03-15 – 2022-03-17 (×7): 5 mg via ORAL
  Filled 2022-03-15 (×8): qty 1

## 2022-03-15 MED ORDER — MORPHINE SULFATE (PF) 2 MG/ML IV SOLN: 2.0000 mg | INTRAVENOUS | Status: DC | PRN

## 2022-03-15 MED ORDER — MORPHINE SULFATE (PF) 4 MG/ML IV SOLN
4.0000 mg | INTRAVENOUS | Status: DC | PRN
  Administered 2022-03-15 (×2): 4 mg via INTRAVENOUS
  Filled 2022-03-15 (×2): qty 1

## 2022-03-15 MED ORDER — POLYETHYLENE GLYCOL 3350 17 G PO PACK
17.0000 g | PACK | Freq: Every day | ORAL | Status: DC | PRN
  Administered 2022-03-17 – 2022-03-18 (×2): 17 g via ORAL
  Filled 2022-03-15 (×4): qty 1

## 2022-03-15 MED ORDER — ONDANSETRON HCL 4 MG/2ML IJ SOLN: 4.0000 mg | Freq: Four times a day (QID) | INTRAMUSCULAR | Status: DC | PRN

## 2022-03-15 MED ORDER — APIXABAN 5 MG PO TABS
10.0000 mg | ORAL_TABLET | Freq: Two times a day (BID) | ORAL | Status: DC
  Administered 2022-03-15 – 2022-03-19 (×9): 10 mg via ORAL
  Filled 2022-03-15 (×10): qty 2

## 2022-03-15 MED ORDER — DOCUSATE SODIUM 100 MG PO CAPS
100.0000 mg | ORAL_CAPSULE | Freq: Two times a day (BID) | ORAL | Status: DC
  Administered 2022-03-15 – 2022-03-19 (×9): 100 mg via ORAL
  Filled 2022-03-15 (×9): qty 1

## 2022-03-15 MED ORDER — ACETAMINOPHEN 325 MG PO TABS: 650.0000 mg | ORAL_TABLET | ORAL | Status: DC | PRN

## 2022-03-15 MED ORDER — BISACODYL 5 MG PO TBEC
5.0000 mg | DELAYED_RELEASE_TABLET | Freq: Every day | ORAL | Status: DC | PRN
  Administered 2022-03-17 – 2022-03-18 (×2): 5 mg via ORAL
  Filled 2022-03-15 (×3): qty 1

## 2022-03-15 NOTE — Telephone Encounter (Signed)
Pharmacy Patient Advocate Encounter  Insurance verification completed.    The patient is insured through [facility]   The patient is currently admitted and ran test claims for the following: Eliquis, Xarelto.  Copays and coinsurance results were relayed to Inpatient clinical team.

## 2022-03-15 NOTE — ED Notes (Signed)
Report given to [facility] 

## 2022-03-15 NOTE — ED Notes (Signed)
Report given to floor nurse. Pt being transported to [facility] at this time. All belongings sent with pt

## 2022-03-15 NOTE — H&P (Signed)
History and Physical    Patient: Andrea Preston [id number] DOB: 1990-12-09 DOA: 03/14/2022 DOS: the patient was seen and examined on 03/15/2022 PCP: Patient, No Pcp Per  Patient coming from: Home - lives with 3 kids, sometimes her baby's father; Lawanda Cousins, [phone]   Chief Complaint: CP/SOB  HPI: [name] is a 31 y.o. female with medical history significant of asthma and recent pregnancy (delivery 9/2) presenting with CP/SOB.  She reports that Saturday night she was on the couch ad she noticed left-sided neck pain.  She popped her neck without relief.  Sunday AM, she started having pain from her chest into her arms and upper abdomen with severe SOB.  It hurt too much to eat and drink.  No prior h/o blood clots.  Her maternal uncle had a blood clot.  She has been fine postnatally until now.  She has been both breast and bottle feeding.    ER Course:  [facility] to [facility] transfer, per Dr. Alcario Drought:  31 yo F, 1 month post partum, doing bottle feeding anyhow.  CP shortness of breath.  Has PE.     Review of Systems: As mentioned in the history of present illness. All other systems reviewed and are negative.  Past Medical History:  Diagnosis Date   Asthma    Past Surgical History:  Procedure Laterality Date   HERNIA REPAIR     x2   OVARIAN CYST REMOVAL     Social History:  reports that she has never smoked. She has never used smokeless tobacco. She reports that she does not currently use alcohol. She reports that she does not currently use drugs.  No Known Allergies  Family History  Problem Relation Age of Onset   Deep vein thrombosis Neg Hx     Prior to Admission medications   Medication Sig Start Date End Date Taking? Authorizing Provider  metFORMIN (GLUCOPHAGE) 500 MG tablet Take 500 mg by mouth daily. 01/20/22   [provider]  metoCLOPramide (REGLAN) 10 MG tablet Take 1 tablet (10 mg total) by mouth every 6 (six) hours. 03/17/21   Charlesetta Shanks, MD   NIFEdipine (PROCARDIA-XL/NIFEDICAL-XL) 30 MG 24 hr tablet Take 30 mg by mouth daily. 01/22/22   [provider]  sertraline (ZOLOFT) 25 MG tablet Take 25 mg by mouth daily. 11/26/21   [provider]    Physical Exam: Vitals:   03/15/22 0723 03/15/22 0858 03/15/22 0900 03/15/22 1106  BP: 107/62 128/84  134/81  Pulse: 94  94 91  Resp: (!) 23   (!) 22  Temp: 98.2 F (36.8 C) 99 F (37.2 C)  98.5 F (36.9 C)  TempSrc:  Oral  Oral  SpO2: 99% 97% 98% 100%  Weight:      Height:       General:  Appears calm and comfortable and is in NAD Eyes:   EOMI, normal lids, iris ENT:  grossly normal hearing, lips & tongue, mmm Neck:  no LAD, masses or thyromegaly Cardiovascular:  RRR, no m/r/g. No LE edema.  Respiratory:   CTA bilaterally with no wheezes/rales/rhonchi.  Normal respiratory effort. Abdomen:  soft, NT, ND Skin:  no rash or induration seen on limited exam Musculoskeletal:  grossly normal tone BUE/BLE, good ROM, no bony abnormality, BLE are symmetric without erythema Psychiatric:  blunted mood and affect, speech fluent and appropriate, AOx3 Neurologic:  CN 2-12 grossly intact, moves all extremities in coordinated fashion, sensation intact   Radiological Exams on Admission: Independently reviewed - see discussion  in A/P where applicable  ECHOCARDIOGRAM LIMITED  Result Date: 03/15/2022    ECHOCARDIOGRAM LIMITED REPORT   Patient Name:   Andrea Preston Date of Exam: 03/15/2022 Medical Rec #:  [id number]    Height:       62.0 in Accession #:    [id number]   Weight:       204.4 lb Date of Birth:  August 10, 1990    BSA:          1.929 m Patient Age:    31 years     BP:           128/84 mmHg Patient Gender: F            HR:           92 bpm. Exam Location:  Inpatient Procedure: Limited Echo, Cardiac Doppler and Color Doppler STAT ECHO Indications:    I26.02 Pulmonary embolus  History:        Patient has no prior history of Echocardiogram examinations.                  Signs/Symptoms:Dyspnea, Chest Pain and Shortness of Breath.  Sonographer:    Roseanna Rainbow RDCS Referring Phys: [id number] Collyn Ribas  Sonographer Comments: Technically difficult study due to poor echo windows. Image acquisition challenging due to patient body habitus. IMPRESSIONS  1. Left ventricular ejection fraction, by estimation, is 60 to 65%. The left ventricle has normal function. The left ventricle has no regional wall motion abnormalities. Left ventricular diastolic function could not be evaluated.  2. Right ventricular systolic function is normal. The right ventricular size is normal. Tricuspid regurgitation signal is inadequate for assessing PA pressure.  3. The mitral valve is normal in structure. No evidence of mitral valve regurgitation. No evidence of mitral stenosis.  4. The aortic valve is normal in structure. Aortic valve regurgitation is not visualized. No aortic stenosis is present.  5. The inferior vena cava is normal in size with greater than 50% respiratory variability, suggesting right atrial pressure of 3 mmHg. FINDINGS  Left Ventricle: Left ventricular ejection fraction, by estimation, is 60 to 65%. The left ventricle has normal function. The left ventricle has no regional wall motion abnormalities. The left ventricular internal cavity size was normal in size. There is  no left ventricular hypertrophy. Left ventricular diastolic function could not be evaluated. Right Ventricle: The right ventricular size is normal. No increase in right ventricular wall thickness. Right ventricular systolic function is normal. Tricuspid regurgitation signal is inadequate for assessing PA pressure. Left Atrium: Left atrial size was normal in size. Right Atrium: Right atrial size was normal in size. Pericardium: There is no evidence of pericardial effusion. Mitral Valve: The mitral valve is normal in structure. No evidence of mitral valve stenosis. Tricuspid Valve: The tricuspid valve is normal in structure.  Tricuspid valve regurgitation is not demonstrated. No evidence of tricuspid stenosis. Aortic Valve: The aortic valve is normal in structure. Aortic valve regurgitation is not visualized. No aortic stenosis is present. Pulmonic Valve: The pulmonic valve was not well visualized. Pulmonic valve regurgitation is not visualized. No evidence of pulmonic stenosis. Aorta: The aortic root is normal in size and structure. Venous: The inferior vena cava is normal in size with greater than 50% respiratory variability, suggesting right atrial pressure of 3 mmHg. IAS/Shunts: No atrial level shunt detected by color flow Doppler. LEFT VENTRICLE PLAX 2D LVIDd:         4.30 cm LVIDs:  2.70 cm LV PW:         1.10 cm LV IVS:        0.90 cm LVOT diam:     2.10 cm LVOT Area:     3.46 cm  LV Volumes (MOD) LV vol d, MOD A2C: 67.2 ml LV vol d, MOD A4C: 87.3 ml LV vol s, MOD A2C: 28.2 ml LV vol s, MOD A4C: 36.6 ml LV SV MOD A2C:     39.0 ml LV SV MOD A4C:     87.3 ml LV SV MOD BP:      45.3 ml RIGHT VENTRICLE            IVC RV S prime:     9.14 cm/s  IVC diam: 1.50 cm TAPSE (M-mode): 1.8 cm LEFT ATRIUM         Index       RIGHT ATRIUM          Index LA diam:    2.20 cm 1.14 cm/m  RA Area:     9.72 cm                                 RA Volume:   17.70 ml 9.17 ml/m   AORTA Ao Asc diam: 2.70 cm  SHUNTS Systemic Diam: 2.10 cm Dani Gobble Croitoru MD Electronically signed by Sanda Klein MD Signature Date/Time: 03/15/2022/11:20:25 AM    Final    CT Angio Chest PE W and/or Wo Contrast  Result Date: 03/14/2022 CLINICAL DATA:  Chest pain EXAM: CT ANGIOGRAPHY CHEST WITH CONTRAST TECHNIQUE: Multidetector CT imaging of the chest was performed using the standard protocol during bolus administration of intravenous contrast. Multiplanar CT image reconstructions and MIPs were obtained to evaluate the vascular anatomy. RADIATION DOSE REDUCTION: This exam was performed according to the departmental dose-optimization program which includes automated  exposure control, adjustment of the mA and/or kV according to patient size and/or use of iterative reconstruction technique. CONTRAST:  146mL OMNIPAQUE IOHEXOL 350 MG/ML SOLN COMPARISON:  None Available. FINDINGS: Cardiovascular: Contrast injection is sufficient to demonstrate satisfactory opacification of the pulmonary arteries to the segmental level. Acute pulmonary embolus with majority of the clot burden in the right lower lobar and proximal segmental arteries. There is evidence of right heart strain with the calculated RV/LV ratio of 1.07. small amount of clot in the lingular branches. The size of the main pulmonary artery is normal. Heart size is normal, with no pericardial effusion. The course and caliber of the aorta are normal. There is no atherosclerotic calcification. Opacification decreased due to pulmonary arterial phase contrast bolus timing. Mediastinum/Nodes: No mediastinal, hilar or axillary lymphadenopathy. Normal visualized thyroid. Thoracic esophageal course is normal. Lungs/Pleura: Airways are patent. No pleural effusion, lobar consolidation, pneumothorax or pulmonary infarction. Upper Abdomen: Contrast bolus timing is not optimized for evaluation of the abdominal organs. The visualized portions of the organs of the upper abdomen are normal. Musculoskeletal: No chest wall abnormality. No bony spinal canal stenosis. Review of the MIP images confirms the above findings. IMPRESSION: 1. Acute pulmonary embolus with majority of the clot burden in the right lower lobar and proximal segmental arteries. Small amount of clot in the lingula branches. 2. CT findings of right heart strain (RV/LV Ratio = 1.07 ) consistent with at least submassive (intermediate risk) PE. The presence of right heart strain has been associated with an increased risk of morbidity and mortality. Critical Value/emergent results were  called by telephone at the time of interpretation on 03/14/2022 at 7:03 pm to provider [facility] , who verbally acknowledged these results. Electronically Signed   By: Ulyses Jarred M.D.   On: 03/14/2022 19:24   CT Soft Tissue Neck W Contrast  Result Date: 03/14/2022 CLINICAL DATA:  Chest pain and shortness of breath EXAM: CT NECK WITH CONTRAST TECHNIQUE: Multidetector CT imaging of the neck was performed using the standard protocol following the bolus administration of intravenous contrast. RADIATION DOSE REDUCTION: This exam was performed according to the departmental dose-optimization program which includes automated exposure control, adjustment of the mA and/or kV according to patient size and/or use of iterative reconstruction technique. CONTRAST:  155mL OMNIPAQUE IOHEXOL 350 MG/ML SOLN COMPARISON:  None Available. FINDINGS: PHARYNX AND LARYNX: The nasopharynx, oropharynx and larynx are normal. Visible portions of the oral cavity, tongue base and floor of mouth are normal. Normal epiglottis, vallecula and pyriform sinuses. The larynx is normal. No retropharyngeal abscess, effusion or lymphadenopathy. SALIVARY GLANDS: Normal parotid, submandibular and sublingual glands. THYROID: Heterogeneous and enlarged thyroid gland, left predominant with 1.8 cm left thyroid lobe nodule. LYMPH NODES: No enlarged or abnormal density lymph nodes. VASCULAR: Major cervical vessels are patent. LIMITED INTRACRANIAL: Normal. VISUALIZED ORBITS: Normal. MASTOIDS AND VISUALIZED PARANASAL SINUSES: No fluid levels or advanced mucosal thickening. No mastoid effusion. SKELETON: No bony spinal canal stenosis. No lytic or blastic lesions. UPPER CHEST: Clear. OTHER: None. IMPRESSION: 1. No acute abnormality of the neck. 2. Incidental heterogeneous and enlarged thyroid with 1.8 cm nodule in the left lobe. Recommend non-emergent thyroid ultrasound. Reference: J Am Coll Radiol. 2015 Feb;12(2): 143-50 Electronically Signed   By: Ulyses Jarred M.D.   On: 03/14/2022 19:07   DG Chest 2 View  Result Date: 03/14/2022 CLINICAL  DATA:  Chest pain, short of breath EXAM: CHEST - 2 VIEW COMPARISON:  None Available. FINDINGS: Normal mediastinum and cardiac silhouette. Normal pulmonary vasculature. No evidence of effusion, infiltrate, or pneumothorax. No acute bony abnormality. IMPRESSION: No acute cardiopulmonary process. Electronically Signed   By: Suzy Bouchard M.D.   On: 03/14/2022 15:38    EKG: Independently reviewed.  Sinus tachycardia with rate 110; nonspecific ST changes with no evidence of acute ischemia   Labs on Admission: I have personally reviewed the available labs and imaging studies at the time of the admission.  Pertinent labs:    Unremarkable BMP BNP 13.1 HS troponin <2 Lactate 0.7 Unremarkable CBC INR 1.1 TSH 0.301 Free T4 0.92   Assessment and Plan: Principal Problem:   Acute pulmonary embolism with acute cor pulmonale (HCC) Active Problems:   Asthma, chronic   Postpartum state   Class 2 obesity due to excess calories with body mass index (BMI) of 37.0 to 37.9 in adult   Thyroid nodule    Acute PE with cor pulmonale -Patient without prior episodes of thromboembolic disease presenting with new PE in the postpartum period -Patient is not showing evidence of hemodynamic instability at this time -Given her hemodynamic stability, she is at intermediate risk -PESI score is Class I, very low risk, indicating a 0-1.6% 30-day mortality risk -S-PESI score is low, indicating that the patient has a 1.1 risk of death and a 1.5% risk of recurrent VTE or non-fatal bleeding -Will admit on telemetry to progressive care unit -R heart strain was seen on CT but echo performed and does not show this at this time -Troponin and BNP as well as echo have been ordered to assess the severity of  clot burden with R heart strain and all are reassuring -Initiate anticoagulation - started on treatment-dose heparin with plan to transition to Eliquis now given reassuring findings -Plantation O2 as needed -We discussed oral  AC treatment options and risks/benefits  -Will request TOC team consultation to assist with cost analysis of the various DOAC options based on her insurance -Patients are at intermediate risk (3-8%/year) for recurrent VTE if initial clot occurred because of pregnancy/estrogen.  Extended oral anticoagulation of indefinite duration should be considered for patients with a first episode of PE with these issues. -The patient understands that thromboembolic disease can be catastrophic and even deadly and that she must be complaint with physician appointments and anticoagulation. -Initial PE can be treated for 6-12 months; will defer duration of therapy to PCP -DVT US ordered; while the legs are normal in appearance and non-painful, if the patient does have an underlying DVT then an IVC filter might need to be considered - particularly if the patient is unable to tolerate OAC therapy   Asthma -prn albuterol  Postpartum -She is still doing some breast feeding, mostly bottle feeding -Hyperestrogenic state likely led to VTE -She does not currently desire future fertility and was planning to request OCPs from her OB/GYN at her upcoming appointment -Given the current VTE, estrogen-containing birth control is contraindicated -Would suggest Mirena IUD vs. Nexplanon  Thyroid nodule -Minimally suppressed TSH with normal T4 -Needs outpatient thyroid US and f/u  Class 2 obesity -Body mass index is 37.38 kg/m..  -Weight loss should be encouraged -Outpatient PCP/bariatric medicine f/u encouraged      Advance Care Planning:   Code Status: Full Code   Consults: PCCM (telephone only); TOC team; pharmacy  DVT Prophylaxis: Heparin - > Eliquis  Family Communication: None present; she is capable of communicating with her mother at this time  Severity of Illness: The appropriate patient status for this patient is INPATIENT. Inpatient status is judged to be reasonable and necessary in order to provide the  required intensity of service to ensure the patient's safety. The patient's presenting symptoms, physical exam findings, and initial radiographic and laboratory data in the context of their chronic comorbidities is felt to place them at high risk for further clinical deterioration. Furthermore, it is not anticipated that the patient will be medically stable for discharge from the hospital within 2 midnights of admission.   * I certify that at the point of admission it is my clinical judgment that the patient will require inpatient hospital care spanning beyond 2 midnights from the point of admission due to high intensity of service, high risk for further deterioration and high frequency of surveillance required.*  Author: Karmen Bongo, MD 03/15/2022 2:48 PM  For on call review [email or web address].

## 2022-03-15 NOTE — ED Notes (Addendum)
ED TO INPATIENT HANDOFF REPORT  ED Nurse Name and Phone #: Tsosie Billingobin Myrl Bynum RN  S Name/Age/Gender Andrea InglesErica Preston 31 y.o. female Room/Bed: MH03/MH03  Code Status   Code Status: Not on file  Home/SNF/Other Home Patient oriented to: self, place, time, and situation Is this baseline? Yes   Triage Complete: Triage complete  Chief Complaint Acute pulmonary embolism with acute cor pulmonale (HCC) [I26.09]  Triage Note C/O neck pain started Saturday; along w/ chest pain and shortness of breath  even at rest that started yesterday. Stated it hurts to move around. Recent vaginal delivery 9/2   Allergies No Known Allergies  Level of Care/Admitting Diagnosis ED Disposition     ED Disposition  Admit   Condition  --   Comment  [facility] [[id number]]  Level of Care: Progressive [102]  Admit to Progressive based on following criteria: CARDIOVASCULAR & THORACIC of moderate stability with acute coronary syndrome symptoms/low risk myocardial infarction/hypertensive urgency/arrhythmias/heart failure potentially compromising stability and stable post cardiovascular intervention patients.  [facility] or [facility] if equivalent level of care is available:: Yes  Interfacility transfer: Yes  Covid Evaluation: Asymptomatic - no recent exposure (last 10 days) testing not required  Diagnosis: Acute pulmonary embolism with acute cor pulmonale [facility](HCC) [[id number]]  Admitting Physician: Heide ScalesEGELER, CHRISTOPHER J [[id number]]  Attending Physician: Heide ScalesEGELER, CHRISTOPHER J [[id number]]  Certification:: I certify this patient will need inpatient services for at least 2 midnights  Estimated Length of Stay: 2          B Medical/Surgery History Past Medical History:  Diagnosis Date   Asthma    Past Surgical History:  Procedure Laterality Date   HERNIA REPAIR       A IV Location/Drains/Wounds Patient Lines/Drains/Airways Status     Active  Line/Drains/Airways     Name Placement date Placement time Site Days   Peripheral IV 03/14/22 20 G 1.88" Right Antecubital 03/14/22  1631  Antecubital  1   Peripheral IV 03/14/22 20 G Left;Posterior Hand 03/14/22  2027  Hand  1   Urethral Catheter Jamison, RN Double-lumen 14 Fr. 03/14/22  2320  Double-lumen  1   External Urinary Catheter 03/14/22  2005  --  1            Intake/Output Last 24 hours  Intake/Output Summary (Last 24 hours) at 03/15/2022 0730 Last data filed at 03/15/2022 0057 Gross per 24 hour  Intake --  Output 2200 ml  Net -2200 ml    Labs/Imaging Results for orders placed or performed during the hospital encounter of 03/14/22 (from the past 48 hour(s))  CBC     Status: Abnormal   Collection Time: 03/14/22  4:32 PM  Result Value Ref Range   WBC 8.4 4.0 - 10.5 K/uL   RBC 5.58 (H) 3.87 - 5.11 MIL/uL   Hemoglobin 13.3 12.0 - 15.0 g/dL   HCT 69.641.3 29.536.0 - 28.446.0 %   MCV 74.0 (L) 80.0 - 100.0 fL   MCH 23.8 (L) 26.0 - 34.0 pg   MCHC 32.2 30.0 - 36.0 g/dL   RDW 13.216.5 (H) 44.011.5 - 10.215.5 %   Platelets 326 150 - 400 K/uL   nRBC 0.0 0.0 - 0.2 %    Comment: Performed at [facility] [place], [address] [place], [place] [id number]  Troponin I (High Sensitivity)     Status: None   Collection Time: 03/14/22  4:32 PM  Result Value Ref Range  Troponin I (High Sensitivity) <2 <18 ng/L    Comment: (NOTE) Elevated high sensitivity troponin I (hsTnI) values and significant  changes across serial measurements may suggest ACS but many other  chronic and acute conditions are known to elevate hsTnI results.  Refer to the "Links" section for chest pain algorithms and additional  guidance. Performed at [facility], [address] [place], [place] [address]   Lipase, blood     Status: None   Collection Time: 03/14/22  4:32 PM  Result Value Ref Range   Lipase 27 11 - 51 U/L    Comment: Performed at [facility], [address] [place], [place]  [address]  Comprehensive metabolic panel     Status: Abnormal   Collection Time: 03/14/22  4:32 PM  Result Value Ref Range   Sodium 137 135 - 145 mmol/L   Potassium 3.6 3.5 - 5.1 mmol/L   Chloride 101 98 - 111 mmol/L   CO2 26 22 - 32 mmol/L   Glucose, Bld 83 70 - 99 mg/dL    Comment: Glucose reference range applies only to samples taken after fasting for at least 8 hours.   BUN 6 6 - 20 mg/dL   Creatinine, Ser 0.91 0.44 - 1.00 mg/dL   Calcium 9.2 8.9 - 10.3 mg/dL   Total Protein 9.1 (H) 6.5 - 8.1 g/dL   Albumin 4.0 3.5 - 5.0 g/dL   AST 26 15 - 41 U/L   ALT 20 0 - 44 U/L   Alkaline Phosphatase 99 38 - 126 U/L   Total Bilirubin 0.7 0.3 - 1.2 mg/dL   GFR, Estimated >60 >60 mL/min    Comment: (NOTE) Calculated using the CKD-EPI Creatinine Equation (2021)    Anion gap 10 5 - 15    Comment: Performed at [facility], [address] [place], [place] [address]  TSH     Status: Abnormal   Collection Time: 03/14/22  7:38 PM  Result Value Ref Range   TSH 0.301 (L) 0.350 - 4.500 uIU/mL    Comment: Performed by a 3rd Generation assay with a functional sensitivity of <=0.01 uIU/mL. Performed at [facility], [place] [address], [place], [place] [address]   T4, free     Status: None   Collection Time: 03/14/22  7:38 PM  Result Value Ref Range   Free T4 0.92 0.61 - 1.12 ng/dL    Comment: (NOTE) Biotin ingestion may interfere with free T4 tests. If the results are inconsistent with the TSH level, previous test results, or the clinical presentation, then consider biotin interference. If needed, order repeat testing after stopping biotin. Performed at [facility], [place] [address], [place], [place] [address]   Brain natriuretic peptide     Status: None   Collection Time: 03/14/22  7:38 PM  Result Value Ref Range   B Natriuretic Peptide 13.1 0.0 - 100.0 pg/mL    Comment: Performed at [facility], [address] [place], [place] [address]  Protime-INR      Status: None   Collection Time: 03/14/22  9:39 PM  Result Value Ref Range   Prothrombin Time 14.3 11.4 - 15.2 seconds   INR 1.1 0.8 - 1.2    Comment: (NOTE) INR goal varies based on device and disease states. Performed at [facility], [address] [place], [place] [address]   Lactic acid, plasma  Status: None   Collection Time: 03/15/22  2:06 AM  Result Value Ref Range   Lactic Acid, Venous 0.7 0.5 - 1.9 mmol/L    Comment: Performed at [facility], [address] [place], [place] [address]  Heparin level (unfractionated)     Status: Abnormal   Collection Time: 03/15/22  3:30 AM  Result Value Ref Range   Heparin Unfractionated 0.77 (H) 0.30 - 0.70 IU/mL    Comment: (NOTE) The clinical reportable range upper limit is being lowered to >1.10 to align with the FDA approved guidance for the current laboratory assay.  If heparin results are below expected values, and patient dosage has  been confirmed, suggest follow up testing of antithrombin III levels. Performed at [facility], [place] [address], [place], [place] [address]    CT Angio Chest PE W and/or Wo Contrast  Result Date: 03/14/2022 CLINICAL DATA:  Chest pain EXAM: CT ANGIOGRAPHY CHEST WITH CONTRAST TECHNIQUE: Multidetector CT imaging of the chest was performed using the standard protocol during bolus administration of intravenous contrast. Multiplanar CT image reconstructions and MIPs were obtained to evaluate the vascular anatomy. RADIATION DOSE REDUCTION: This exam was performed according to the departmental dose-optimization program which includes automated exposure control, adjustment of the mA and/or kV according to patient size and/or use of iterative reconstruction technique. CONTRAST:  173mL OMNIPAQUE IOHEXOL 350 MG/ML SOLN COMPARISON:  None Available. FINDINGS: Cardiovascular: Contrast injection is sufficient to demonstrate satisfactory opacification of the pulmonary arteries to the  segmental level. Acute pulmonary embolus with majority of the clot burden in the right lower lobar and proximal segmental arteries. There is evidence of right heart strain with the calculated RV/LV ratio of 1.07. small amount of clot in the lingular branches. The size of the main pulmonary artery is normal. Heart size is normal, with no pericardial effusion. The course and caliber of the aorta are normal. There is no atherosclerotic calcification. Opacification decreased due to pulmonary arterial phase contrast bolus timing. Mediastinum/Nodes: No mediastinal, hilar or axillary lymphadenopathy. Normal visualized thyroid. Thoracic esophageal course is normal. Lungs/Pleura: Airways are patent. No pleural effusion, lobar consolidation, pneumothorax or pulmonary infarction. Upper Abdomen: Contrast bolus timing is not optimized for evaluation of the abdominal organs. The visualized portions of the organs of the upper abdomen are normal. Musculoskeletal: No chest wall abnormality. No bony spinal canal stenosis. Review of the MIP images confirms the above findings. IMPRESSION: 1. Acute pulmonary embolus with majority of the clot burden in the right lower lobar and proximal segmental arteries. Small amount of clot in the lingula branches. 2. CT findings of right heart strain (RV/LV Ratio = 1.07 ) consistent with at least submassive (intermediate risk) PE. The presence of right heart strain has been associated with an increased risk of morbidity and mortality. Critical Value/emergent results were called by telephone at the time of interpretation on 03/14/2022 at 7:03 pm to provider [facility] , who verbally acknowledged these results. Electronically Signed   By: Ulyses Jarred M.D.   On: 03/14/2022 19:24   CT Soft Tissue Neck W Contrast  Result Date: 03/14/2022 CLINICAL DATA:  Chest pain and shortness of breath EXAM: CT NECK WITH CONTRAST TECHNIQUE: Multidetector CT imaging of the neck was performed using the  standard protocol following the bolus administration of intravenous contrast. RADIATION DOSE REDUCTION: This exam was performed according to the departmental dose-optimization program which includes automated exposure control, adjustment of the mA and/or kV according to patient size and/or use  of iterative reconstruction technique. CONTRAST:  171mL OMNIPAQUE IOHEXOL 350 MG/ML SOLN COMPARISON:  None Available. FINDINGS: PHARYNX AND LARYNX: The nasopharynx, oropharynx and larynx are normal. Visible portions of the oral cavity, tongue base and floor of mouth are normal. Normal epiglottis, vallecula and pyriform sinuses. The larynx is normal. No retropharyngeal abscess, effusion or lymphadenopathy. SALIVARY GLANDS: Normal parotid, submandibular and sublingual glands. THYROID: Heterogeneous and enlarged thyroid gland, left predominant with 1.8 cm left thyroid lobe nodule. LYMPH NODES: No enlarged or abnormal density lymph nodes. VASCULAR: Major cervical vessels are patent. LIMITED INTRACRANIAL: Normal. VISUALIZED ORBITS: Normal. MASTOIDS AND VISUALIZED PARANASAL SINUSES: No fluid levels or advanced mucosal thickening. No mastoid effusion. SKELETON: No bony spinal canal stenosis. No lytic or blastic lesions. UPPER CHEST: Clear. OTHER: None. IMPRESSION: 1. No acute abnormality of the neck. 2. Incidental heterogeneous and enlarged thyroid with 1.8 cm nodule in the left lobe. Recommend non-emergent thyroid ultrasound. Reference: J Am Coll Radiol. 2015 Feb;12(2): 143-50 Electronically Signed   By: Ulyses Jarred M.D.   On: 03/14/2022 19:07   DG Chest 2 View  Result Date: 03/14/2022 CLINICAL DATA:  Chest pain, short of breath EXAM: CHEST - 2 VIEW COMPARISON:  None Available. FINDINGS: Normal mediastinum and cardiac silhouette. Normal pulmonary vasculature. No evidence of effusion, infiltrate, or pneumothorax. No acute bony abnormality. IMPRESSION: No acute cardiopulmonary process. Electronically Signed   By: Suzy Bouchard M.D.   On: 03/14/2022 15:38    Pending Labs Unresulted Labs (From admission, onward)     Start     Ordered   03/16/22 0500  Heparin level (unfractionated)  Daily at 5am,   R      03/14/22 1956   03/15/22 1030  Heparin level (unfractionated)  Once-Timed,   URGENT        03/15/22 0418   03/14/22 1930  T3, free  Once,   URGENT        03/14/22 1929   03/14/22 1929  Lactic acid, plasma  Now then every 2 hours,   R (with STAT occurrences)      03/14/22 1929   03/14/22 1517  Pregnancy, urine  Once,   URGENT       Comments: If UA unable to be obtained, obtain hcg serum qualitative lab test    03/14/22 1516            Vitals/Pain Today's Vitals   03/15/22 0200 03/15/22 0300 03/15/22 0600 03/15/22 0723  BP: 106/79 113/69 117/64 107/62  Pulse: 85 79 90 94  Resp: 20 19 18  (!) 23  Temp:  98.5 F (36.9 C)  98.2 F (36.8 C)  TempSrc:  Oral    SpO2: 100% 100% 100% 99%  Weight:      Height:      PainSc:        Isolation Precautions No active isolations  Medications Medications  heparin ADULT infusion 100 units/mL (25000 units/263mL) (1,200 Units/hr Intravenous Rate/Dose Change 03/15/22 0418)  morphine (PF) 4 MG/ML injection 4 mg (4 mg Intravenous Given 03/15/22 0639)  acetaminophen (TYLENOL) tablet 650 mg (has no administration in time range)  sodium chloride 0.9 % bolus 1,000 mL (0 mLs Intravenous Stopped 03/14/22 1903)  morphine (PF) 4 MG/ML injection 4 mg (4 mg Intravenous Given 03/14/22 1637)  iohexol (OMNIPAQUE) 350 MG/ML injection 100 mL (100 mLs Intravenous Contrast Given 03/14/22 1836)  fentaNYL (SUBLIMAZE) injection 50 mcg (50 mcg Intravenous Given 03/14/22 1937)  heparin bolus via infusion 5,000 Units (5,000 Units Intravenous Bolus from Bag  03/14/22 2026)  morphine (PF) 4 MG/ML injection 4 mg (4 mg Intravenous Given 03/14/22 2034)    Mobility walks Moderate fall risk   Focused Assessments    R Recommendations: See Admitting Provider Note  Report given to:   [place]  Additional Notes: O2 at 3 l/[place] and foley Heparin drip

## 2022-03-15 NOTE — ED Notes (Signed)
Heparin rate changed to 40ml/hr as requested by Andrea Preston. Patient has no s/s of bleeding or hemorrhage. Patients IV's are intact and patent . Patient is lying bed , foley catheter in placed , breathing on 3L of 02 saturating at 100%. Continuing monitoring and care.

## 2022-03-15 NOTE — Progress Notes (Signed)
ANTICOAGULATION CONSULT NOTE - Follow Up Consult  Pharmacy Consult for heparin Indication: pulmonary embolus  Labs: Recent Labs    03/14/22 1632 03/14/22 2139 03/15/22 0330  HGB 13.3  --   --   HCT 41.3  --   --   PLT 326  --   --   LABPROT  --  14.3  --   INR  --  1.1  --   HEPARINUNFRC  --   --  0.77*  CREATININE 0.91  --   --   TROPONINIHS <2  --   --     Assessment: 31yo female supratherapeutic on heparin with initial dosing for PE; no infusion issues or signs of bleeding per RN.  Goal of Therapy:  Heparin level 0.3-0.7 units/ml   Plan:  Will decrease heparin infusion by 1 unit/kg/hr to 1200 units/hr and check level in 6 hours.    Wynona Neat, PharmD, BCPS  03/15/2022,4:24 AM

## 2022-03-15 NOTE — Progress Notes (Addendum)
ANTICOAGULATION CONSULT NOTE - Initial Consult  Pharmacy Consult for Heparin>>apixaban Indication: pulmonary embolus  No Known Allergies  Patient Measurements: Height: 5\' 2"  (157.5 cm) Weight: 92.7 kg (204 lb 5.9 oz) IBW/kg (Calculated) : 50.1 Heparin Dosing Weight: 71.6 kg  Vital Signs: Temp: 99 F (37.2 C) (10/03 0858) Temp Source: Oral (10/03 0858) BP: 128/84 (10/03 0858) Pulse Rate: 94 (10/03 0900)  Labs: Recent Labs    03/14/22 1632 03/14/22 2139 03/15/22 0330  HGB 13.3  --   --   HCT 41.3  --   --   PLT 326  --   --   LABPROT  --  14.3  --   INR  --  1.1  --   HEPARINUNFRC  --   --  0.77*  CREATININE 0.91  --   --   TROPONINIHS <2  --   --      Estimated Creatinine Clearance: 95.8 mL/min (by C-G formula based on SCr of 0.91 mg/dL).   Medical History: Past Medical History:  Diagnosis Date   Asthma     Medications:  Medications Prior to Admission  Medication Sig Dispense Refill Last Dose   acetaminophen (TYLENOL) 500 MG tablet Take 1,000 mg by mouth every 6 (six) hours as needed for mild pain or headache.   Past Week    Scheduled:  Infusions:  PRN:   Assessment: 41 yof with a history of asthma, abdominal hernia repair, vaginal delivery 1 month ago. Patient is presenting with SOB and pleuritic chest pain. No anticoag PTA. Heparin per pharmacy consult placed for pulmonary embolus with RHS.   Heparin level therapeutic at 0.33 on heparin 1200 units/hr after rate reduced. Hemoglobin 13.3, plateles 326 - stable. No issues with heparin infusing, no signs of bleeding reported.   Ok to transition to apixaban per Dr. Lorin Mercy. $4 copay.  Goal of Therapy:  Monitor platelets by anticoagulation protocol: Yes   Plan:  Dc heparin Apixaban 10mg  BID x7d then 5mg  BID Rx will follow peripherally  Onnie Boer, PharmD, BCIDP, AAHIVP, CPP Infectious Disease Pharmacist 03/15/2022 2:27 PM

## 2022-03-15 NOTE — Progress Notes (Signed)
  Echocardiogram 2D Echocardiogram has been performed.  Andrea Preston 03/15/2022, 10:06 AM

## 2022-03-15 NOTE — TOC Benefit Eligibility Note (Signed)
Patient Teacher, English as a foreign language completed.    The patient is currently admitted and upon discharge could be taking Eliquis 5 mg.  The current 30 day co-pay is $4.00.   The patient is currently admitted and upon discharge could be taking Xarelto 20 mg.  The current 30 day co-pay is $4.00.   The patient is insured through [place], [place] Patient Advocate Specialist [place] Patient Advocate Team Direct Number: [phone]  Fax: [phone]

## 2022-03-16 ENCOUNTER — Inpatient Hospital Stay (HOSPITAL_COMMUNITY): Payer: Medicaid Other

## 2022-03-16 DIAGNOSIS — J4520 Mild intermittent asthma, uncomplicated: Secondary | ICD-10-CM | POA: Diagnosis not present

## 2022-03-16 DIAGNOSIS — I2699 Other pulmonary embolism without acute cor pulmonale: Secondary | ICD-10-CM

## 2022-03-16 DIAGNOSIS — E6609 Other obesity due to excess calories: Secondary | ICD-10-CM | POA: Diagnosis not present

## 2022-03-16 DIAGNOSIS — I2609 Other pulmonary embolism with acute cor pulmonale: Secondary | ICD-10-CM | POA: Diagnosis not present

## 2022-03-16 LAB — CBC
HCT: 35.1 % — ABNORMAL LOW (ref 36.0–46.0)
Hemoglobin: 11.1 g/dL — ABNORMAL LOW (ref 12.0–15.0)
MCH: 24 pg — ABNORMAL LOW (ref 26.0–34.0)
MCHC: 31.6 g/dL (ref 30.0–36.0)
MCV: 75.8 fL — ABNORMAL LOW (ref 80.0–100.0)
Platelets: 296 10*3/uL (ref 150–400)
RBC: 4.63 MIL/uL (ref 3.87–5.11)
RDW: 16.7 % — ABNORMAL HIGH (ref 11.5–15.5)
WBC: 7.8 10*3/uL (ref 4.0–10.5)
nRBC: 0 % (ref 0.0–0.2)

## 2022-03-16 LAB — HIV ANTIBODY (ROUTINE TESTING W REFLEX): HIV Screen 4th Generation wRfx: NONREACTIVE

## 2022-03-16 MED ORDER — ACETAMINOPHEN 325 MG PO TABS
650.0000 mg | ORAL_TABLET | Freq: Four times a day (QID) | ORAL | Status: DC
  Administered 2022-03-16 – 2022-03-19 (×11): 650 mg via ORAL
  Filled 2022-03-16 (×12): qty 2

## 2022-03-16 MED ORDER — CHLORHEXIDINE GLUCONATE CLOTH 2 % EX PADS
6.0000 | MEDICATED_PAD | Freq: Every day | CUTANEOUS | Status: DC
  Administered 2022-03-16: 6 via TOPICAL

## 2022-03-16 MED ORDER — CYCLOBENZAPRINE HCL 5 MG PO TABS
5.0000 mg | ORAL_TABLET | Freq: Three times a day (TID) | ORAL | Status: DC
  Administered 2022-03-16 – 2022-03-19 (×9): 5 mg via ORAL
  Filled 2022-03-16 (×9): qty 1

## 2022-03-16 NOTE — Assessment & Plan Note (Addendum)
Provoked VTE due to recent pregnancy.  Patient was placed on full anticoagulation with good toleration.  Further work up with echocardiogram showed preserved LV systolic function with EF 60 to 65%, RV with preserved systolic function. No significant valvular disease.   Her hospitalization was prolonged due to persistent chest pain, dyspnea and resting tachycardia.  Her symptoms have been improving.  Her oxymetry on room air on ambulation with no 02 desaturation on the day of discharge, with no indication for home supplemental 02.  Plan to follow up ambulatory oxymetry on room air as outpatient in 2 to 3 weeks.    Patient will continue cyclobenzaprine and topical diclofenac for left neck/ shoulder pain.  Continue anticoagulation with apixaban Follow up as outpatient. In future pregnancies patient will need deep vein thrombosis prophylaxis.

## 2022-03-16 NOTE — Assessment & Plan Note (Signed)
Calculated BMI id 37,0

## 2022-03-16 NOTE — TOC Progression Note (Addendum)
Transition of Care [facility]) - Progression Note    Patient Details  Name: Andrea Preston MRN: [id number] Date of Birth: 04-06-1991  Transition of Care [facility]) CM/SW Contact  Zenon Mayo, RN Phone Number: 03/16/2022, 4:20 PM  Clinical Narrative:     From home, PE, on eliquis, sats dropeed to 87%, on 1 liter, awaiting auth for anticoagulant medication. Co pay amount for eliquis is 4.00 thru patient's medicaid and also 4.00 for xarelto.   TOC following.         Expected Discharge Plan and Services                                                 Social Determinants of Health (SDOH) Interventions    Readmission Risk Interventions     No data to display

## 2022-03-16 NOTE — Discharge Instructions (Addendum)
Information on my medicine - ELIQUIS (apixaban)  Why was Eliquis prescribed for you? Eliquis was prescribed to treat blood clots that may have been found in the veins of your legs (deep vein thrombosis) or in your lungs (pulmonary embolism) and to reduce the risk of them occurring again.  What do You need to know about Eliquis ? The starting dose is 10 mg (two 5 mg tablets) taken TWICE daily for the FIRST SEVEN (7) DAYS, then on (enter date)  03/22/22  the dose is reduced to ONE 5 mg tablet taken TWICE daily.  Eliquis may be taken with or without food.   Try to take the dose about the same time in the morning and in the evening. If you have difficulty swallowing the tablet whole please discuss with your pharmacist how to take the medication safely.  Take Eliquis exactly as prescribed and DO NOT stop taking Eliquis without talking to the doctor who prescribed the medication.  Stopping may increase your risk of developing a new blood clot.  Refill your prescription before you run out.  After discharge, you should have regular check-up appointments with your healthcare provider that is prescribing your Eliquis.    What do you do if you miss a dose? If a dose of ELIQUIS is not taken at the scheduled time, take it as soon as possible on the same day and twice-daily administration should be resumed. The dose should not be doubled to make up for a missed dose.  Important Safety Information A possible side effect of Eliquis is bleeding. You should call your healthcare provider right away if you experience any of the following: Bleeding from an injury or your nose that does not stop. Unusual colored urine (red or dark brown) or unusual colored stools (red or black). Unusual bruising for unknown reasons. A serious fall or if you hit your head (even if there is no bleeding).  Some medicines may interact with Eliquis and might increase your risk of bleeding or clotting while on Eliquis. To  help avoid this, consult your healthcare provider or pharmacist prior to using any new prescription or non-prescription medications, including herbals, vitamins, non-steroidal anti-inflammatory drugs (NSAIDs) and supplements.  This website has more information on Eliquis (apixaban): http://www.eliquis.com/eliquis/home

## 2022-03-16 NOTE — Progress Notes (Signed)
   03/16/22 0900  Mobility  Activity Ambulated independently in hallway  Activity Response Tolerated well  Distance Ambulated (ft) 500 ft  $Mobility charge 1 Mobility  Level of Assistance Independent  Assistive Device None  Mobility Referral Yes   Mobility Specialist Progress Note  Pre-Mobility: 121 HR, 93% SpO2(RA) During Mobility: 126 HR, 88% SpO2(RA) Post-Mobility: 119 HR, 92% SpO2(RA)  Pt was in bed and agreeable. X3 standing breaks d/t SOB. Returned to bed w/ all needs met and call bell in reach.  Lucious Groves Mobility Specialist

## 2022-03-16 NOTE — Progress Notes (Signed)
Bilateral lower extremity venous duplex has been completed. Preliminary results can be found in CV Proc through chart review.   03/16/22 9:01 AM Andrea Preston RVT

## 2022-03-16 NOTE — Hospital Course (Addendum)
Andrea Preston was admitted to the hospital with the working diagnosis of acute pulmonary embolism.  31 yo female with the past medical history of asthma, and recent pregnancy 09.02.23 who presented with chest pain and dyspnea. 3 days prior coming to the hospital, while seating in an coach she experienced left sided neck pain, the following day she had chest pain, radiating into her arms, and associated with severe dyspnea. On her initial physical examination her blood pressure was 107/62, HR 94, RR 23 and 02 saturation 99%, lungs with no wheezing or rhonchi, heart with S1 and S2 present and rhythmic, abdomen soft and no lower extremity edema.   Na 137, K 3,6 CL 101 bicarbonate 26 glucose 83 bun 6 cr 0,91  Wbc 8,4 hgb 13,3 plt 326   Chest radiograph with no cardiomegaly or infiltrates CT chest with acute pulmonary embolism with majority of the clot burden on the right lower lobar and proximal segmental arteries. Small amount of clot in the lingula branches. CT findings with right heart strain, consistent with submassive PE.   EKG 110 bpm, normal axis, normal intervals, sinus rhythm with no ST segment changes, negative T in lead III and AvF.   Neck CT with no acute changes.  Incidental 1,8 cm left thyroid lobe nodule.   Patient was placed on full anticoagulation Lower extremity US with no DVT Echocardiogram with preserved LV and RV function.   Hospitalization prolonged due to persistent dyspnea on exertion and intermittent chest pain.   10/07 symptoms of dyspnea and chest pain are improving, patient will need supplemental 02. Continue anticoagulation with apixaban.

## 2022-03-16 NOTE — Progress Notes (Signed)
Progress Note   Patient: Andrea Preston OXB:[id number] DOB: 1991-06-02 DOA: 03/14/2022     1 DOS: the patient was seen and examined on 03/16/2022   Brief hospital course: Andrea Preston was admitted to the hospital with the working diagnosis of acute pulmonary embolism.  31 yo female with the past medical history of asthma, and recent pregnancy 09.02.23 who presented with chest pain and dyspnea. 3 days prior while seating in an coach she experienced left sided neck pain, the following day she had chest pain, radiating into her arms, and associated with severe dyspnea. On her initial physical examination her blood pressure was 107/62, HR 94, RR 23 and 02 saturation 99%, lungs with no wheezing or rhonchi, heart with S1 and S2 present and rhythmic, abdomen soft and no lower extremity edema.   Na 137, KK 3,6 CL 101 bicarbonate 26 glucose 83 bun 6 cr 0,91  Wbc 8,4 hgb 13,3 plt 326   Chest radiograph with no cardiomegaly or infiltrates CT chest with acute pulmonary embolism with majority of the clot burden on the right lower lobar and proximal segmental arteries. Small amount of clot in the lingula branches. CT findings with right heart strain, consistent with submassive PE.   EKG 110 bpm, normal axis, normal intervals, sinus rhythm with no ST segment changes, negative T in lead III and AvF.   Neck CT with no acute changes.  Incidental 1,8 cm left thyroid lobe nodule.   Patient was placed on full anticoagulation Lower extremity US with no DVT Echocardiogram with preserved LV and RV function.   Assessment and Plan: * Acute pulmonary embolism with acute cor pulmonale ([place]) Patient continue symptomatic with chest pain and dyspnea on exertion.  Provoked VTE due to recent pregnancy.   HR is in the 100's  Oxymetry is 91% on room air.  Patient has been transitioned to oral anticoagulation.  Echocardiogram with preserved RV and LV systolic function.   Plan to continue close telemetry and oxymetry  monitoring Patient has been placed on apixaban.  Continue pain control with acetaminophen, oxycodone and morphine.  Add tid flexeril.   Asthma, chronic No clinical signs of exacerbation.   Thyroid nodule Will need follow up as outpatient.   Postpartum state Follow up as outpatient.  Systolic blood pressure 683 to 140 mmHg.   Class 2 obesity due to excess calories with body mass index (BMI) of 37.0 to 37.9 in adult Calculated BMI id 37,0        Subjective: Patient continue to have dyspnea and chest pain, continue with left neck pain.   Physical Exam: Vitals:   03/16/22 0158 03/16/22 0506 03/16/22 1031 03/16/22 1300  BP: 126/80 122/71 119/79 (!) 141/84  Pulse: (!) 103  (!) 110   Resp:  (!) 25 (!) 35 (!) 28  Temp: 99 F (37.2 C) 98.5 F (36.9 C)  98.5 F (36.9 C)  TempSrc: Oral Oral Oral Oral  SpO2: 90% 91% 95% 98%  Weight: 91.8 kg     Height:       Neurology awake and alert ENT with no pallor Cardiovascular with S1 and S2 present and rhythmic with no gallops rubs or murmurs Respiratory with no rales or wheezing Abdomen with no distention  No lower extremity edema  Data Reviewed:    Family Communication: no family at the bedside   Disposition: Status is: Inpatient Remains inpatient appropriate because: acute pulmonary embolism.   Planned Discharge Destination: Home    Author: Tawni Millers, MD 03/16/2022 3:52  PM  For on call review [email or web address].

## 2022-03-16 NOTE — Assessment & Plan Note (Addendum)
No clinical signs of exacerbation.  Continue with as needed albuterol.

## 2022-03-16 NOTE — Assessment & Plan Note (Addendum)
Follow up as outpatient.  No breast feeding during treatment for pulmonary embolism.

## 2022-03-16 NOTE — Assessment & Plan Note (Signed)
Will need follow-up as outpatient 

## 2022-03-17 DIAGNOSIS — E6609 Other obesity due to excess calories: Secondary | ICD-10-CM | POA: Diagnosis not present

## 2022-03-17 DIAGNOSIS — J4520 Mild intermittent asthma, uncomplicated: Secondary | ICD-10-CM | POA: Diagnosis not present

## 2022-03-17 DIAGNOSIS — I2609 Other pulmonary embolism with acute cor pulmonale: Secondary | ICD-10-CM | POA: Diagnosis not present

## 2022-03-17 DIAGNOSIS — E876 Hypokalemia: Secondary | ICD-10-CM | POA: Diagnosis present

## 2022-03-17 LAB — BASIC METABOLIC PANEL
Anion gap: 7 (ref 5–15)
BUN: 9 mg/dL (ref 6–20)
CO2: 25 mmol/L (ref 22–32)
Calcium: 8.5 mg/dL — ABNORMAL LOW (ref 8.9–10.3)
Chloride: 102 mmol/L (ref 98–111)
Creatinine, Ser: 1.08 mg/dL — ABNORMAL HIGH (ref 0.44–1.00)
GFR, Estimated: >60 mL/min (ref >60)
Glucose, Bld: 122 mg/dL — ABNORMAL HIGH (ref 70–99)
Potassium: 3.2 mmol/L — ABNORMAL LOW (ref 3.5–5.1)
Sodium: 134 mmol/L — ABNORMAL LOW (ref 135–145)

## 2022-03-17 LAB — T3, FREE: T3, Free: 3.3 pg/mL (ref 2.0–4.4)

## 2022-03-17 LAB — CBC
HCT: 32.9 % — ABNORMAL LOW (ref 36.0–46.0)
Hemoglobin: 10.8 g/dL — ABNORMAL LOW (ref 12.0–15.0)
MCH: 24.4 pg — ABNORMAL LOW (ref 26.0–34.0)
MCHC: 32.8 g/dL (ref 30.0–36.0)
MCV: 74.3 fL — ABNORMAL LOW (ref 80.0–100.0)
Platelets: 273 10*3/uL (ref 150–400)
RBC: 4.43 MIL/uL (ref 3.87–5.11)
RDW: 16.6 % — ABNORMAL HIGH (ref 11.5–15.5)
WBC: 8.7 10*3/uL (ref 4.0–10.5)
nRBC: 0 % (ref 0.0–0.2)

## 2022-03-17 MED ORDER — POTASSIUM CHLORIDE CRYS ER 20 MEQ PO TBCR
40.0000 meq | EXTENDED_RELEASE_TABLET | ORAL | Status: AC
  Administered 2022-03-17 (×2): 40 meq via ORAL
  Filled 2022-03-17 (×2): qty 2

## 2022-03-17 NOTE — Evaluation (Signed)
Physical Therapy Evaluation Patient Details Name: Andrea Preston MRN: [id number] DOB: [birth year] Today's Date: 03/17/2022  History of Present Illness  31 y.o. female presents to [facility] hospital on 03/14/2022 with chest pain and dyspnea, found to have PE. Pt also with recent pregnancy 02/12/2022. No other PMH on file.  Clinical Impression  Pt presents to PT with deficits in gait, cardiopulmonary function and endurance. Pt desats when mobilizing on room air, requiring 1-2L [place] to maintain sats in low 90s. Pt is tachycardic at rest in 110s with increase to 130s, and tachypneic up to low 40s observed during mobility. Pt will benefit from frequent mobilization in an effort to improve activity tolerance. PT anticipates no post-acute PT needs at this time.       Recommendations for follow up therapy are one component of a multi-disciplinary discharge planning process, led by the attending physician.  Recommendations may be updated based on patient status, additional functional criteria and insurance authorization.  Follow Up Recommendations No PT follow up      Assistance Recommended at Discharge PRN  Patient can return home with the following  A little help with bathing/dressing/bathroom;Assistance with cooking/housework;Help with stairs or ramp for entrance    Equipment Recommendations None recommended by PT  Recommendations for Other Services       Functional Status Assessment Patient has had a recent decline in their functional status and demonstrates the ability to make significant improvements in function in a reasonable and predictable amount of time.     Precautions / Restrictions Precautions Precautions: Other (comment) Precaution Comments: monitor vitals Restrictions Weight Bearing Restrictions: No      Mobility  Bed Mobility Overal bed mobility: Independent                  Transfers Overall transfer level: Independent                       Ambulation/Gait Ambulation/Gait assistance: Modified independent (Device/Increase time) Gait Distance (Feet): 250 Feet Assistive device: None Gait Pattern/deviations: Step-through pattern Gait velocity: reduced Gait velocity interpretation: 1.31 - 2.62 ft/sec, indicative of limited community ambulator   General Gait Details: slowed step-through gait  Stairs            Wheelchair Mobility    Modified Rankin (Stroke Patients Only)       Balance Overall balance assessment: Mild deficits observed, not formally tested                                           Pertinent Vitals/Pain Pain Assessment Pain Assessment: No/denies pain    Home Living Family/patient expects to be discharged to:: Private residence Living Arrangements: Children Available Help at Discharge: Family;Available PRN/intermittently (grandmother and other family members) Type of Home: Apartment Home Access: Level entry       Home Layout: One level Home Equipment: None      Prior Function Prior Level of Function : Independent/Modified Independent;Driving                     Hand Dominance        Extremity/Trunk Assessment   Upper Extremity Assessment Upper Extremity Assessment: Overall WFL for tasks assessed    Lower Extremity Assessment Lower Extremity Assessment: Overall WFL for tasks assessed    Cervical / Trunk Assessment Cervical / Trunk Assessment: Normal  Communication   Communication:  No difficulties  Cognition Arousal/Alertness: Awake/alert Behavior During Therapy: WFL for tasks assessed/performed Overall Cognitive Status: Within Functional Limits for tasks assessed                                          General Comments General comments (skin integrity, edema, etc.): pt on RA upon PT arrival, oxygen sats not being monitored. Pt ambulates to bathroom, sats dropping to mid 80s after toileting. Pt sats maintained well during  ambulation on 2L initially and then 1L later in bout. With attempts to wean back to room air pt desats to 85%, recovering at rest to low 90s.    Exercises     Assessment/Plan    PT Assessment Patient needs continued PT services  PT Problem List Decreased activity tolerance;Cardiopulmonary status limiting activity       PT Treatment Interventions Therapeutic exercise;Patient/family education;Gait training    PT Goals (Current goals can be found in the Care Plan section)  Acute Rehab PT Goals Patient Stated Goal: to go home PT Goal Formulation: With patient Time For Goal Achievement: 03/31/22 Potential to Achieve Goals: Good Additional Goals Additional Goal #1: Pt will report 0/4 DOE when ambulating for >250' to demonstrate improved activity tolerance Additional Goal #2: Pt will ambulate for >250' on room air with oxygen sats >92%    Frequency Min 2X/week     Co-evaluation               AM-PAC PT "6 Clicks" Mobility  Outcome Measure Help needed turning from your back to your side while in a flat bed without using bedrails?: None Help needed moving from lying on your back to sitting on the side of a flat bed without using bedrails?: None Help needed moving to and from a bed to a chair (including a wheelchair)?: None Help needed standing up from a chair using your arms (e.g., wheelchair or bedside chair)?: None Help needed to walk in hospital room?: None Help needed climbing 3-5 steps with a railing? : A Little 6 Click Score: 23    End of Session Equipment Utilized During Treatment: Oxygen Activity Tolerance: Patient tolerated treatment well Patient left: in bed;with call bell/phone within reach Nurse Communication: Mobility status PT Visit Diagnosis: Other abnormalities of gait and mobility (R26.89)    Time: 0626-9485 PT Time Calculation (min) (ACUTE ONLY): 17 min   Charges:   PT Evaluation $PT Eval Low Complexity: [place], PT,  DPT Acute Rehabilitation Office [phone]   Zenaida Niece 03/17/2022, 8:43 AM

## 2022-03-17 NOTE — Evaluation (Signed)
Occupational Therapy Evaluation Patient Details Name: Andrea Preston MRN: [id number] DOB: July 14, 1990 Today's Date: 03/17/2022   History of Present Illness 31 y.o. female presents to [facility] hospital on 03/14/2022 with chest pain and dyspnea, found to have PE. Pt also with recent pregnancy 02/12/2022. No other PMH on file.   Clinical Impression   Pt independent at baseline with ADLs and functional mobility. Lives with family who can provide assist at d/c. Pt currently mod I-min A for ADLs, independent with bed mobility and transfers without AD. Pt with HR up to 120 and SpO2 dropping to 86% on RA with ADL task at sink, returning to 90's once supine in bed. Pt presenting with impairments listed below, will follow acutely. Anticipate pt will not need OT follow up pending progression.     Recommendations for follow up therapy are one component of a multi-disciplinary discharge planning process, led by the attending physician.  Recommendations may be updated based on patient status, additional functional criteria and insurance authorization.   Follow Up Recommendations  No OT follow up    Assistance Recommended at Discharge PRN  Patient can return home with the following A little help with bathing/dressing/bathroom;A little help with walking and/or transfers;Assist for transportation;Help with stairs or ramp for entrance;Assistance with cooking/housework    Functional Status Assessment  Patient has had a recent decline in their functional status and demonstrates the ability to make significant improvements in function in a reasonable and predictable amount of time.  Equipment Recommendations  None recommended by OT    Recommendations for Other Services PT consult     Precautions / Restrictions Precautions Precautions: Other (comment) Precaution Comments: monitor vitals Restrictions Weight Bearing Restrictions: No      Mobility Bed Mobility Overal bed mobility: Independent                   Transfers Overall transfer level: Independent                        Balance Overall balance assessment: Mild deficits observed, not formally tested                                         ADL either performed or assessed with clinical judgement   ADL Overall ADL's : At baseline Eating/Feeding: Modified independent   Grooming: Modified independent   Upper Body Bathing: Supervision/ safety   Lower Body Bathing: Minimal assistance   Upper Body Dressing : Supervision/safety   Lower Body Dressing: Minimal assistance   Toilet Transfer: Supervision/safety   Toileting- Clothing Manipulation and Hygiene: Supervision/safety       Functional mobility during ADLs: Supervision/safety       Vision   Vision Assessment?: No apparent visual deficits     Perception     Praxis      Pertinent Vitals/Pain Pain Assessment Pain Assessment: No/denies pain     Hand Dominance     Extremity/Trunk Assessment Upper Extremity Assessment Upper Extremity Assessment: Overall WFL for tasks assessed   Lower Extremity Assessment Lower Extremity Assessment: Overall WFL for tasks assessed   Cervical / Trunk Assessment Cervical / Trunk Assessment: Normal   Communication Communication Communication: No difficulties   Cognition Arousal/Alertness: Awake/alert Behavior During Therapy: WFL for tasks assessed/performed Overall Cognitive Status: Within Functional Limits for tasks assessed  General Comments  HR up to 120 with short distance mobility/ADL at the sink, SpO2 down to 86% on RA, recovering quickly into low-mid 90's    Exercises     Shoulder Instructions      Home Living Family/patient expects to be discharged to:: Private residence Living Arrangements: Children Available Help at Discharge: Family;Available PRN/intermittently Type of Home: Apartment Home Access: Level entry     Home  Layout: One level     Bathroom Shower/Tub: Tub/shower unit         Home Equipment: None          Prior Functioning/Environment Prior Level of Function : Independent/Modified Independent;Driving                        OT Problem List: Decreased strength;Decreased range of motion;Decreased activity tolerance;Impaired balance (sitting and/or standing);Cardiopulmonary status limiting activity      OT Treatment/Interventions: Self-care/ADL training;Therapeutic exercise;Energy conservation;DME and/or AE instruction;Therapeutic activities;Patient/family education;Balance training    OT Goals(Current goals can be found in the care plan section) Acute Rehab OT Goals Patient Stated Goal: none stated OT Goal Formulation: With patient Time For Goal Achievement: 03/31/22 Potential to Achieve Goals: Good ADL Goals Pt Will Perform Upper Body Dressing: Independently;standing;sitting Pt Will Perform Lower Body Dressing: Independently;sitting/lateral leans;sit to/from stand Pt Will Transfer to Toilet: Independently;ambulating;regular height toilet Pt Will Perform Tub/Shower Transfer: Tub transfer;Shower transfer;ambulating  OT Frequency: Min 2X/week    Co-evaluation              AM-PAC OT "6 Clicks" Daily Activity     Outcome Measure Help from another person eating meals?: None Help from another person taking care of personal grooming?: None Help from another person toileting, which includes using toliet, bedpan, or urinal?: None Help from another person bathing (including washing, rinsing, drying)?: A Little Help from another person to put on and taking off regular upper body clothing?: None Help from another person to put on and taking off regular lower body clothing?: A Little 6 Click Score: 22   End of Session Nurse Communication: Mobility status  Activity Tolerance: Patient tolerated treatment well Patient left: in bed;with call bell/phone within reach;with bed  alarm set  OT Visit Diagnosis: Muscle weakness (generalized) (M62.81)                Time: [id number] OT Time Calculation (min): 12 min Charges:  OT General Charges $OT Visit: 1 Visit OT Evaluation $OT Eval Low Complexity: 1 Low  [name], OTD, OTR/L Acute Rehab [phone] - [place] 03/17/2022, 12:18 PM

## 2022-03-17 NOTE — Progress Notes (Signed)
Pt up to the bathroom in room,with ShOB.02 sat 94% a while walking short distance. More relaxed at rest. Bed sheets changed due to sweating.

## 2022-03-17 NOTE — Progress Notes (Addendum)
Progress Note   Patient: Andrea Preston FYB:[id number] DOB: 1991/02/25 DOA: 03/14/2022     2 DOS: the patient was seen and examined on 03/17/2022   Brief hospital course: Andrea Preston was admitted to the hospital with the working diagnosis of acute pulmonary embolism.  31 yo female with the past medical history of asthma, and recent pregnancy 09.02.23 who presented with chest pain and dyspnea. 3 days prior while seating in an coach she experienced left sided neck pain, the following day she had chest pain, radiating into her arms, and associated with severe dyspnea. On her initial physical examination her blood pressure was 107/62, HR 94, RR 23 and 02 saturation 99%, lungs with no wheezing or rhonchi, heart with S1 and S2 present and rhythmic, abdomen soft and no lower extremity edema.   Na 137, KK 3,6 CL 101 bicarbonate 26 glucose 83 bun 6 cr 0,91  Wbc 8,4 hgb 13,3 plt 326   Chest radiograph with no cardiomegaly or infiltrates CT chest with acute pulmonary embolism with majority of the clot burden on the right lower lobar and proximal segmental arteries. Small amount of clot in the lingula branches. CT findings with right heart strain, consistent with submassive PE.   EKG 110 bpm, normal axis, normal intervals, sinus rhythm with no ST segment changes, negative T in lead III and AvF.   Neck CT with no acute changes.  Incidental 1,8 cm left thyroid lobe nodule.   Patient was placed on full anticoagulation Lower extremity US with no DVT Echocardiogram with preserved LV and RV function.   Hospitalization prolonged due to persistent dyspnea on exertion and intermittent chest pain.   Assessment and Plan: * Acute pulmonary embolism with acute cor pulmonale ([place]) Patient continue symptomatic with chest pain and dyspnea on exertion.  Provoked VTE due to recent pregnancy.   HR continue in the 100's  Oxymetry continue at  91% on room air.  Patient has been transitioned to oral anticoagulation.   Echocardiogram with preserved RV and LV systolic function.   Plan to continue close telemetry and oxymetry monitoring Anticoagulation with apixaban.  Continue pain control with acetaminophen, oxycodone and morphine.  Continue with tid flexeril.  Check ambulatory 02 on room air, she may need home 02 at the time of her discharge.   Asthma, chronic No clinical signs of exacerbation.   Thyroid nodule Will need follow up as outpatient.   Postpartum state Follow up as outpatient.  Systolic blood pressure 527 to 140 mmHg.   Class 2 obesity due to excess calories with body mass index (BMI) of 37.0 to 37.9 in adult Calculated BMI id 37,0  Hypokalemia Hyponatremia.   Renal function with stable serum cr at 1,0 with K at 3,2 and serum bicarbonate at 25 Na 134.   Plan to add 80 meq Kcl in 2 divided doses and follow up renal function in am.         Subjective: Patient continue to have dyspnea on exertion and intermittent chest pain.   Physical Exam: Vitals:   03/16/22 1300 03/16/22 2011 03/16/22 2300 03/17/22 0526  BP: (!) 141/84 134/79 126/82 (!) 115/91  Pulse:   (!) 104 96  Resp: (!) 28 15 20 18   Temp: 98.5 F (36.9 C) 98.6 F (37 C) 98.5 F (36.9 C) 98.4 F (36.9 C)  TempSrc: Oral Oral Oral Oral  SpO2: 98% 92% 90% 91%  Weight:   92.4 kg   Height:       Neurology awake and alert  ENT with no pallor or icterus Cardiovascular with S1 and S2 present and rhythmic with no gallops, rubs or murmurs Respiratory with no rales or wheezing Abdomen not distended  No lower extremity edema  Data Reviewed:    Family Communication: no family at the bedside   Disposition: Status is: Inpatient Remains inpatient appropriate because: symptomatic acute pulmonary embolism.   Planned Discharge Destination: Home      Author: Coralie Keens, MD 03/17/2022 9:57 AM  For on call review [email or web address].

## 2022-03-17 NOTE — Plan of Care (Signed)
PT alert and oriented no s/s distress. Makes needs known. States she is not having any more postpartum discharge at this time.  Breasts assessed for hardness as PT states she was breast feeding prior.  Provider has been notified and nursing concern for safety of PT resuming breastfeeding while on Eliquis when discharged. Provider will address with Patient when discharing. PT is on room air, but SPO2 drops when exerting. VSS except SPO2 with activity. Call bell in reach, using appropriately.   Problem: Education: Goal: Knowledge of General Education information will improve Description: Including pain rating scale, medication(s)/side effects and non-pharmacologic comfort measures Outcome: Progressing   Problem: Health Behavior/Discharge Planning: Goal: Ability to manage health-related needs will improve Outcome: Progressing   Problem: Clinical Measurements: Goal: Ability to maintain clinical measurements within normal limits will improve Outcome: Progressing Goal: Will remain free from infection Outcome: Progressing Goal: Diagnostic test results will improve Outcome: Progressing Goal: Respiratory complications will improve Outcome: Progressing Goal: Cardiovascular complication will be avoided Outcome: Progressing   Problem: Activity: Goal: Risk for activity intolerance will decrease Outcome: Progressing   Problem: Nutrition: Goal: Adequate nutrition will be maintained Outcome: Progressing   Problem: Coping: Goal: Level of anxiety will decrease Outcome: Progressing   Problem: Elimination: Goal: Will not experience complications related to bowel motility Outcome: Progressing Goal: Will not experience complications related to urinary retention Outcome: Progressing   Problem: Pain Managment: Goal: General experience of comfort will improve Outcome: Progressing   Problem: Safety: Goal: Ability to remain free from injury will improve Outcome: Progressing   Problem: Skin  Integrity: Goal: Risk for impaired skin integrity will decrease Outcome: Progressing

## 2022-03-17 NOTE — Assessment & Plan Note (Addendum)
Hyponatremia.   Stable renal function, electrolytes were corrected.  At the time of her discharge her serum cr is 0,83, K is 4,2 and serum bicarbonate is 25.

## 2022-03-18 DIAGNOSIS — D649 Anemia, unspecified: Secondary | ICD-10-CM | POA: Diagnosis present

## 2022-03-18 DIAGNOSIS — I2609 Other pulmonary embolism with acute cor pulmonale: Secondary | ICD-10-CM | POA: Diagnosis not present

## 2022-03-18 DIAGNOSIS — E876 Hypokalemia: Secondary | ICD-10-CM

## 2022-03-18 DIAGNOSIS — J4520 Mild intermittent asthma, uncomplicated: Secondary | ICD-10-CM | POA: Diagnosis not present

## 2022-03-18 DIAGNOSIS — E6609 Other obesity due to excess calories: Secondary | ICD-10-CM | POA: Diagnosis not present

## 2022-03-18 LAB — CBC
HCT: 34.1 % — ABNORMAL LOW (ref 36.0–46.0)
Hemoglobin: 10.9 g/dL — ABNORMAL LOW (ref 12.0–15.0)
MCH: 24 pg — ABNORMAL LOW (ref 26.0–34.0)
MCHC: 32 g/dL (ref 30.0–36.0)
MCV: 74.9 fL — ABNORMAL LOW (ref 80.0–100.0)
Platelets: 291 10*3/uL (ref 150–400)
RBC: 4.55 MIL/uL (ref 3.87–5.11)
RDW: 16.7 % — ABNORMAL HIGH (ref 11.5–15.5)
WBC: 6 10*3/uL (ref 4.0–10.5)
nRBC: 0 % (ref 0.0–0.2)

## 2022-03-18 LAB — BASIC METABOLIC PANEL
Anion gap: 7 (ref 5–15)
BUN: 8 mg/dL (ref 6–20)
CO2: 25 mmol/L (ref 22–32)
Calcium: 8.9 mg/dL (ref 8.9–10.3)
Chloride: 107 mmol/L (ref 98–111)
Creatinine, Ser: 0.83 mg/dL (ref 0.44–1.00)
GFR, Estimated: >60 mL/min (ref >60)
Glucose, Bld: 116 mg/dL — ABNORMAL HIGH (ref 70–99)
Potassium: 4.2 mmol/L (ref 3.5–5.1)
Sodium: 139 mmol/L (ref 135–145)

## 2022-03-18 MED ORDER — GUAIFENESIN 100 MG/5ML PO LIQD: 5.0000 mL | ORAL | Status: DC | PRN

## 2022-03-18 MED ORDER — DICLOFENAC SODIUM 1 % EX GEL
2.0000 g | Freq: Four times a day (QID) | CUTANEOUS | Status: DC
  Administered 2022-03-18 – 2022-03-19 (×5): 2 g via TOPICAL
  Filled 2022-03-18: qty 100

## 2022-03-18 NOTE — TOC Initial Note (Addendum)
Transition of Care [facility]) - Initial/Assessment Note    Patient Details  Name: Andrea Preston MRN: [id number] Date of Birth: [birth year]  Transition of Care [facility]) CM/SW Contact:    Marilu Favre, RN Phone Number: 03/18/2022, 8:14 AM  Clinical Narrative:                 Patient from home by self. Has family close by who can assist if needed.   PCP [place] , she sees different providers there.   Discuss possible need for home oxygen.   NCM will need ambulatory  oxygen saturation note ( nursing aware).   If home oxygen needed, [facility] will bring portable oxygen DME to bedside prior to discharge and arrange a time with patient to deliver home oxygen DME  Patient has transportation home.   Patient voiced understanding to all of above   Expected Discharge Plan: Home/Self Care Barriers to Discharge: Continued Medical Work up   Patient Goals and CMS Choice Patient states their goals for this hospitalization and ongoing recovery are:: to return to home CMS Medicare.gov Compare Post Acute Care list provided to:: Patient Choice offered to / list presented to : Patient  Expected Discharge Plan and Services Expected Discharge Plan: Home/Self Care In-house Referral: NA Discharge Planning Services: CM Consult Post Acute Care Choice: Durable Medical Equipment Living arrangements for the past 2 months: Apartment                 DME Arranged: Oxygen DME Agency: [facility]       HH Arranged: NA          Prior Living Arrangements/Services Living arrangements for the past 2 months: Apartment Lives with:: Self Patient language and need for interpreter reviewed:: Yes Do you feel safe going back to the place where you live?: Yes      Need for Family Participation in Patient Care: Yes (Comment) Care giver support system in place?: Yes (comment)   Criminal Activity/Legal Involvement Pertinent to Current Situation/Hospitalization: No - Comment as  needed  Activities of Daily Living Home Assistive Devices/Equipment: None ADL Screening (condition at time of admission) Patient's cognitive ability adequate to safely complete daily activities?: No Is the patient deaf or have difficulty hearing?: No Does the patient have difficulty seeing, even when wearing glasses/contacts?: No Does the patient have difficulty concentrating, remembering, or making decisions?: No Patient able to express need for assistance with ADLs?: No Does the patient have difficulty dressing or bathing?: No Independently performs ADLs?: No Does the patient have difficulty walking or climbing stairs?: No Weakness of Legs: None Weakness of Arms/Hands: None  Permission Sought/Granted   Permission granted to share information with : No              Emotional Assessment Appearance:: Appears stated age Attitude/Demeanor/Rapport: Engaged Affect (typically observed): Accepting Orientation: : Oriented to Self, Oriented to Place, Oriented to  Time, Oriented to Situation Alcohol / Substance Use: Not Applicable Psych Involvement: No (comment)  Admission diagnosis:  Acute pulmonary embolism with acute cor pulmonale (HCC) [I26.09] Acute pulmonary embolism, unspecified pulmonary embolism type, unspecified whether acute cor pulmonale present (HCC) [I26.99] Patient Active Problem List   Diagnosis Date Noted   Hypokalemia 03/17/2022   Asthma, chronic 03/15/2022   Postpartum state 03/15/2022   Class 2 obesity due to excess calories with body mass index (BMI) of 37.0 to 37.9 in adult 03/15/2022   Thyroid nodule 03/15/2022   Acute pulmonary embolism with acute cor pulmonale (HCC)  03/14/2022   PCP:  Patient, No Pcp Per Pharmacy:   [facility] [address] [place], [place] - [address] [place] [address] Phone: [phone] Fax: [phone]  [id number] - [place], [place] - [address] [place] [address] [place] [address] Phone: [phone] Fax: [phone]  [facility] [address] [place] [address] Phone: [phone] Fax: [phone]     Social Determinants of Health (SDOH) Interventions    Readmission Risk Interventions     No data to display

## 2022-03-18 NOTE — Plan of Care (Addendum)
PT alert and oriented. Per provider request ordered pump so that PT can relieve breast pressure from engorgement. Requested Lactation Consultation so that PT can properly stop breastfeeding and avoid mastitis. Lactation Consultation ordered.    Problem: Education: Goal: Knowledge of General Education information will improve Description: Including pain rating scale, medication(s)/side effects and non-pharmacologic comfort measures Outcome: Progressing   Problem: Health Behavior/Discharge Planning: Goal: Ability to manage health-related needs will improve Outcome: Progressing   Problem: Clinical Measurements: Goal: Ability to maintain clinical measurements within normal limits will improve Outcome: Progressing Goal: Will remain free from infection Outcome: Progressing Goal: Diagnostic test results will improve Outcome: Progressing Goal: Respiratory complications will improve Outcome: Progressing Goal: Cardiovascular complication will be avoided Outcome: Progressing   Problem: Activity: Goal: Risk for activity intolerance will decrease Outcome: Progressing   Problem: Nutrition: Goal: Adequate nutrition will be maintained Outcome: Progressing   Problem: Coping: Goal: Level of anxiety will decrease Outcome: Progressing   Problem: Elimination: Goal: Will not experience complications related to bowel motility Outcome: Progressing Goal: Will not experience complications related to urinary retention Outcome: Progressing   Problem: Pain Managment: Goal: General experience of comfort will improve Outcome: Progressing   Problem: Safety: Goal: Ability to remain free from injury will improve Outcome: Progressing   Problem: Skin Integrity: Goal: Risk for impaired skin integrity will decrease Outcome: Progressing

## 2022-03-18 NOTE — Progress Notes (Addendum)
Progress Note   Patient: Andrea Preston TFT:[id number] DOB: 12-May-1991 DOA: 03/14/2022     3 DOS: the patient was seen and examined on 03/18/2022   Brief hospital course: Andrea Preston was admitted to the hospital with the working diagnosis of acute pulmonary embolism.  31 yo female with the past medical history of asthma, and recent pregnancy 09.02.23 who presented with chest pain and dyspnea. 3 days prior while seating in an coach she experienced left sided neck pain, the following day she had chest pain, radiating into her arms, and associated with severe dyspnea. On her initial physical examination her blood pressure was 107/62, HR 94, RR 23 and 02 saturation 99%, lungs with no wheezing or rhonchi, heart with S1 and S2 present and rhythmic, abdomen soft and no lower extremity edema.   Na 137, KK 3,6 CL 101 bicarbonate 26 glucose 83 bun 6 cr 0,91  Wbc 8,4 hgb 13,3 plt 326   Chest radiograph with no cardiomegaly or infiltrates CT chest with acute pulmonary embolism with majority of the clot burden on the right lower lobar and proximal segmental arteries. Small amount of clot in the lingula branches. CT findings with right heart strain, consistent with submassive PE.   EKG 110 bpm, normal axis, normal intervals, sinus rhythm with no ST segment changes, negative T in lead III and AvF.   Neck CT with no acute changes.  Incidental 1,8 cm left thyroid lobe nodule.   Patient was placed on full anticoagulation Lower extremity US with no DVT Echocardiogram with preserved LV and RV function.   Hospitalization prolonged due to persistent dyspnea on exertion and intermittent chest pain.   Assessment and Plan: * Acute pulmonary embolism with acute cor pulmonale (HCC) Patient continue symptomatic with chest pain and dyspnea on exertion.  Provoked VTE due to recent pregnancy.   Heart rate 80 to 100. Blood pressure systolic 108 to 130 mmHg.  Oxymetry continue at  95% on room air but down to 88% on  ambulation on room air.  Patient has been transitioned to oral anticoagulation.  Echocardiogram with preserved RV and LV systolic function.   Plan to continue close telemetry and oxymetry monitoring Anticoagulation with apixaban.  Continue pain control with acetaminophen, oxycodone and morphine.  Continue with tid flexeril.  Add topical diclofenac to left shoulder/ neck region.  Will arrange for home 02.  Plan to continue monitoring for the next 24 hrs.  Continue to encourage mobility,   Asthma, chronic No clinical signs of exacerbation.   Thyroid nodule Will need follow up as outpatient.   Postpartum state Follow up as outpatient.  Breast feeding, will provide patient with breast pump to prevent mastitis.   Class 2 obesity due to excess calories with body mass index (BMI) of 37.0 to 37.9 in adult Calculated BMI id 37,0  Hypokalemia Hyponatremia.   Stable renal function, serum cr is 0,83, K is 4,2 and serum bicarbonate is 25.   Anemia Chronic anemia, with stable hgb at 10,9 Will check iron panel, in am.          Subjective: Patient with improvement in her symptoms, but not back to baseline, continue to have dyspnea on exertion and chest pain,   Physical Exam: Vitals:   03/17/22 2003 03/18/22 0207 03/18/22 0503 03/18/22 0732  BP: 115/71 108/66 117/74 120/84  Pulse: (!) 107  85 89  Resp: (!) 22   20  Temp: 99.1 F (37.3 C) 97.7 F (36.5 C) 97.7 F (36.5 C) 98.5 F (  36.9 C)  TempSrc: Oral Oral Oral Oral  SpO2: 93% 96% 95% 96%  Weight:  93 kg    Height:       Neurology awake and alert ENT with mild pallor Cardiovascular with S1 and S2 present with no gallops rubs or murmurs Respiratory with no rales or wheezing, noted with mild increase work of breathing at rest.  Abdomen with no distention  No lower extremity edema Data Reviewed:    Family Communication: no family at the bedside   Disposition: Status is: Inpatient Remains inpatient appropriate  because: symptomatic pulmonary embolism.   Planned Discharge Destination: Home      Author: Tawni Millers, MD 03/18/2022 9:34 AM  For on call review [email or web address].

## 2022-03-18 NOTE — Progress Notes (Signed)
   03/18/22 0900  Mobility  Activity Ambulated independently in hallway  Activity Response Tolerated well  Distance Ambulated (ft) 400 ft  $Mobility charge 1 Mobility  Level of Assistance Independent  Assistive Device None  Mobility Referral Yes   Mobility Specialist Progress Note  Pre-Mobility: 118HR, 95%SpO2 During Mobility: 123 HR,  Pt was in bed and agreeable. X3 standing breaks d/t c/o SOB. Returned EOB w/ all needs met and call bell in reach. RN notified.    Lucious Groves Mobility Specialist

## 2022-03-18 NOTE — Assessment & Plan Note (Addendum)
Chronic anemia, with stable hgb at 10,9 Iron deficiency anemia with serum iron of 22, TIBC 356, transferrin saturation 6 and ferritin 22.  Plan to start patient on oral iron supplementation and follow up iron stores as outpatient.

## 2022-03-18 NOTE — Progress Notes (Signed)
SATURATION QUALIFICATIONS: (This note is used to comply with regulatory documentation for home oxygen)  Patient Saturations on Room Air at Rest = 95%  Patient Saturations on Room Air while Ambulating = 88% (Recovered quickly)   Patient Saturations on N/A Liters of oxygen while Ambulating = N/A%  Please briefly explain why patient needs home oxygen:

## 2022-03-19 DIAGNOSIS — I2609 Other pulmonary embolism with acute cor pulmonale: Secondary | ICD-10-CM | POA: Diagnosis not present

## 2022-03-19 DIAGNOSIS — D649 Anemia, unspecified: Secondary | ICD-10-CM | POA: Diagnosis not present

## 2022-03-19 DIAGNOSIS — E6609 Other obesity due to excess calories: Secondary | ICD-10-CM | POA: Diagnosis not present

## 2022-03-19 DIAGNOSIS — J4520 Mild intermittent asthma, uncomplicated: Secondary | ICD-10-CM | POA: Diagnosis not present

## 2022-03-19 LAB — CBC
HCT: 34.1 % — ABNORMAL LOW (ref 36.0–46.0)
Hemoglobin: 10.8 g/dL — ABNORMAL LOW (ref 12.0–15.0)
MCH: 24 pg — ABNORMAL LOW (ref 26.0–34.0)
MCHC: 31.7 g/dL (ref 30.0–36.0)
MCV: 75.8 fL — ABNORMAL LOW (ref 80.0–100.0)
Platelets: 283 K/uL (ref 150–400)
RBC: 4.5 MIL/uL (ref 3.87–5.11)
RDW: 17 % — ABNORMAL HIGH (ref 11.5–15.5)
WBC: 5.3 K/uL (ref 4.0–10.5)
nRBC: 0 % (ref 0.0–0.2)

## 2022-03-19 LAB — IRON AND TIBC
Iron: 22 ug/dL — ABNORMAL LOW (ref 28–170)
Saturation Ratios: 6 % — ABNORMAL LOW (ref 10.4–31.8)
TIBC: 356 ug/dL (ref 250–450)
UIBC: 334 ug/dL

## 2022-03-19 LAB — TRANSFERRIN: Transferrin: 245 mg/dL (ref 192–382)

## 2022-03-19 LAB — FERRITIN: Ferritin: 22 ng/mL (ref 11–307)

## 2022-03-19 MED ORDER — DICLOFENAC SODIUM 1 % EX GEL: 2.0000 g | Freq: Four times a day (QID) | CUTANEOUS | 0 refills | Status: AC

## 2022-03-19 MED ORDER — FERROUS SULFATE 325 (65 FE) MG PO TABS: 325.0000 mg | ORAL_TABLET | Freq: Every day | ORAL | Status: DC

## 2022-03-19 MED ORDER — ALBUTEROL SULFATE HFA 108 (90 BASE) MCG/ACT IN AERS: 1.0000 | INHALATION_SPRAY | RESPIRATORY_TRACT | 0 refills | Status: AC | PRN

## 2022-03-19 MED ORDER — APIXABAN 5 MG PO TABS: ORAL_TABLET | ORAL | 0 refills | Status: AC

## 2022-03-19 MED ORDER — APIXABAN 5 MG PO TABS: 10.0000 mg | ORAL_TABLET | Freq: Two times a day (BID) | ORAL | Status: DC

## 2022-03-19 MED ORDER — ALBUTEROL SULFATE HFA 108 (90 BASE) MCG/ACT IN AERS: 1.0000 | INHALATION_SPRAY | RESPIRATORY_TRACT | Status: DC | PRN

## 2022-03-19 MED ORDER — CYCLOBENZAPRINE HCL 5 MG PO TABS: 5.0000 mg | ORAL_TABLET | Freq: Three times a day (TID) | ORAL | 0 refills | Status: AC | PRN

## 2022-03-19 MED ORDER — APIXABAN 5 MG PO TABS: 10.0000 mg | ORAL_TABLET | Freq: Two times a day (BID) | ORAL | 0 refills | Status: DC

## 2022-03-19 MED ORDER — POLYETHYLENE GLYCOL 3350 17 G PO PACK: 17.0000 g | PACK | Freq: Every day | ORAL | 0 refills | Status: AC | PRN

## 2022-03-19 MED ORDER — APIXABAN (ELIQUIS) VTE STARTER PACK (10MG AND 5MG): ORAL_TABLET | ORAL | 0 refills | Status: DC

## 2022-03-19 MED ORDER — BISACODYL 5 MG PO TBEC
10.0000 mg | DELAYED_RELEASE_TABLET | Freq: Once | ORAL | Status: AC
  Administered 2022-03-19: 10 mg via ORAL
  Filled 2022-03-19: qty 2

## 2022-03-19 MED ORDER — FERROUS SULFATE 325 (65 FE) MG PO TABS: 325.0000 mg | ORAL_TABLET | Freq: Every day | ORAL | 0 refills | Status: AC

## 2022-03-19 NOTE — Progress Notes (Signed)
Mobility Specialist Progress Note:   03/19/22 1122  Mobility  Activity Ambulated with assistance in hallway  Activity Response Tolerated well  Distance Ambulated (ft) 300 ft  $Mobility charge 1 Mobility  Level of Assistance Independent after set-up  Assistive Device None  Mobility Referral Yes   Pt received in bed willing to participate in mobility. Complaints of feeling SOB. Required 2 short standing rest breaks. Left in bed with call bell in reach and all needs met.   [facility] [employer], mining Chat only

## 2022-03-19 NOTE — Plan of Care (Signed)

## 2022-03-19 NOTE — Discharge Summary (Addendum)
Physician Discharge Summary   Patient: Andrea Preston MRN: [id number] DOB: 06/30/1990  Admit date:     03/14/2022  Discharge date: 03/19/22  Discharge Physician: Jimmy Picket Zymarion Favorite   PCP: Patient, No Pcp Per   Recommendations at discharge:    Patient will continue anticoagulation with apixaban, for provoked by pregnancy No breast feeding during treatment for pulmonary embolism.  Continue topical diclofenac and oral cyclobenzaprine for left neck/ shoulder pain.  Added oral iron supplementation for iron deficiency anemia, follow up iron stores as outpatient.  Follow up thyroid nodule as outpatient.   Discharge Diagnoses: Principal Problem:   Acute pulmonary embolism with acute cor pulmonale (HCC) Active Problems:   Asthma, chronic   Thyroid nodule   Postpartum state   Class 2 obesity due to excess calories with body mass index (BMI) of 37.0 to 37.9 in adult   Hypokalemia   Anemia  Resolved Problems:   * No resolved hospital problems. [facility] Course: Mrs. Laake was admitted to the hospital with the working diagnosis of acute pulmonary embolism.  31 yo female with the past medical history of asthma, and recent pregnancy 09.02.23 who presented with chest pain and dyspnea. 3 days prior coming to the hospital, while seating in an coach she experienced left sided neck pain, the following day she had chest pain, radiating into her arms, and associated with severe dyspnea. On her initial physical examination her blood pressure was 107/62, HR 94, RR 23 and 02 saturation 99%, lungs with no wheezing or rhonchi, heart with S1 and S2 present and rhythmic, abdomen soft and no lower extremity edema.   Na 137, K 3,6 CL 101 bicarbonate 26 glucose 83 bun 6 cr 0,91  Wbc 8,4 hgb 13,3 plt 326   Chest radiograph with no cardiomegaly or infiltrates CT chest with acute pulmonary embolism with majority of the clot burden on the right lower lobar and proximal segmental arteries. Small amount of  clot in the lingula branches. CT findings with right heart strain, consistent with submassive PE.   EKG 110 bpm, normal axis, normal intervals, sinus rhythm with no ST segment changes, negative T in lead III and AvF.   Neck CT with no acute changes.  Incidental 1,8 cm left thyroid lobe nodule.   Patient was placed on full anticoagulation Lower extremity US with no DVT Echocardiogram with preserved LV and RV function.   Hospitalization prolonged due to persistent dyspnea on exertion and intermittent chest pain.   10/07 symptoms of dyspnea and chest pain are improving, patient will need supplemental 02. Continue anticoagulation with apixaban.   Assessment and Plan: * Acute pulmonary embolism with acute cor pulmonale (HCC) Provoked VTE due to recent pregnancy.  Patient was placed on full anticoagulation with good toleration.  Further work up with echocardiogram showed preserved LV systolic function with EF 60 to 65%, RV with preserved systolic function. No significant valvular disease.   Her hospitalization was prolonged due to persistent chest pain, dyspnea and resting tachycardia.  Her symptoms have been improving.  Her oxymetry on room air on ambulation with no 02 desaturation on the day of discharge, with no indication for home supplemental 02.  Plan to follow up ambulatory oxymetry on room air as outpatient in 2 to 3 weeks.    Patient will continue cyclobenzaprine and topical diclofenac for left neck/ shoulder pain.  Continue anticoagulation with apixaban Follow up as outpatient. In future pregnancies patient will need deep vein thrombosis prophylaxis.   Asthma, chronic No clinical  signs of exacerbation.  Continue with as needed albuterol.   Thyroid nodule Will need follow up as outpatient.   Postpartum state Follow up as outpatient.  No breast feeding during treatment for pulmonary embolism.   Class 2 obesity due to excess calories with body mass index (BMI) of 37.0 to  37.9 in adult Calculated BMI id 37,0  Hypokalemia Hyponatremia.   Stable renal function, electrolytes were corrected.  At the time of her discharge her serum cr is 0,83, K is 4,2 and serum bicarbonate is 25.   Anemia Chronic anemia, with stable hgb at 10,9 Iron deficiency anemia with serum iron of 22, TIBC 356, transferrin saturation 6 and ferritin 22.  Plan to start patient on oral iron supplementation and follow up iron stores as outpatient.           Consultants: none  Procedures performed: none   Disposition: Home Diet recommendation:  Discharge Diet Orders (From admission, onward)     Start     Ordered   03/19/22 0000  Diet - low sodium heart healthy        03/19/22 1058           Regular diet DISCHARGE MEDICATION: Allergies as of 03/19/2022   No Known Allergies      Medication List     TAKE these medications    acetaminophen 500 MG tablet Commonly known as: TYLENOL Take 1,000 mg by mouth every 6 (six) hours as needed for mild pain or headache.   albuterol 108 (90 Base) MCG/ACT inhaler Commonly known as: VENTOLIN HFA Inhale 1 puff into the lungs every 4 (four) hours as needed for wheezing or shortness of breath.   apixaban 5 MG Tabs tablet Commonly known as: ELIQUIS Take 2 tablets (10 mg total) by mouth 2 (two) times daily for 2 days, THEN 1 tablet (5 mg total) 2 (two) times daily for 28 days. Start taking on: March 19, 2022   cyclobenzaprine 5 MG tablet Commonly known as: FLEXERIL Take 1 tablet (5 mg total) by mouth 3 (three) times daily as needed for muscle spasms (neck pain.).   diclofenac Sodium 1 % Gel Commonly known as: VOLTAREN Apply 2 g topically 4 (four) times daily for 14 days. Apply to left neck, left shoulder.   ferrous sulfate 325 (65 FE) MG tablet Take 1 tablet (325 mg total) by mouth daily with breakfast. Start taking on: March 20, 2022   polyethylene glycol 17 g packet Commonly known as: MIRALAX / GLYCOLAX Take 17 g by  mouth daily as needed for mild constipation.               Durable Medical Equipment  (From admission, onward)           Start     Ordered   03/17/22 1051  For home use only DME oxygen  Once       Question Answer Comment  Length of Need 6 Months   Mode or (Route) Nasal cannula   Liters per Minute 2   Frequency Continuous (stationary and portable oxygen unit needed)   Oxygen conserving device Yes   Oxygen delivery system Gas      03/17/22 [place]. Schedule an appointment as soon as possible for a visit.   Contact information: [address], [place] [address].  [phone]  Discharge Exam: Filed Weights   03/16/22 2300 03/18/22 0207 03/19/22 0424  Weight: 92.4 kg 93 kg 93.1 kg   BP 112/76 (BP Location: Left Arm)   Pulse 86   Temp 97.6 F (36.4 C) (Oral)   Resp 20   Ht 5\' 2"  (1.575 m)   Wt 93.1 kg   SpO2 97%   BMI 37.55 kg/m   Patient with improvement in her symptoms no chest pain and dyspnea continue to improve.   Neurology awake and alert ENT with mild pallor  Cardiovascular with S1 and S2 present and rhythmic with no gallops, rubs or murmurs Respiratory with no rales or wheezing Abdomen with no distention  No lower extremity edema   Condition at discharge: stable  The results of significant diagnostics from this hospitalization (including imaging, microbiology, ancillary and laboratory) are listed below for reference.   Imaging Studies: VAS [place] LOWER EXTREMITY VENOUS (DVT)  Result Date: 03/16/2022  Lower Venous DVT Study Patient Name:  CHERIA WHOBREY  Date of Exam:   03/16/2022 Medical Rec #: [id number]     Accession #:    [id number] Date of Birth: 1991-06-05     Patient Gender: F Patient Age:   62 years Exam Location:  [facility] Procedure:      VAS [place] LOWER EXTREMITY VENOUS (DVT) Referring Phys: Karmen Bongo  --------------------------------------------------------------------------------  Indications: Pulmonary embolism.  Risk Factors: Confirmed PE. Comparison Study: No prior studies. Performing Technologist: Oliver Hum RVT  Examination Guidelines: A complete evaluation includes B-mode imaging, spectral Doppler, color Doppler, and power Doppler as needed of all accessible portions of each vessel. Bilateral testing is considered an integral part of a complete examination. Limited examinations for reoccurring indications may be performed as noted. The reflux portion of the exam is performed with the patient in reverse Trendelenburg.  +---------+---------------+---------+-----------+----------+--------------+ RIGHT    CompressibilityPhasicitySpontaneityPropertiesThrombus Aging +---------+---------------+---------+-----------+----------+--------------+ CFV      Full           Yes      Yes                                 +---------+---------------+---------+-----------+----------+--------------+ SFJ      Full                                                        +---------+---------------+---------+-----------+----------+--------------+ FV Prox  Full                                                        +---------+---------------+---------+-----------+----------+--------------+ FV Mid   Full                                                        +---------+---------------+---------+-----------+----------+--------------+ FV DistalFull                                                        +---------+---------------+---------+-----------+----------+--------------+  PFV      Full                                                        +---------+---------------+---------+-----------+----------+--------------+ POP      Full           Yes      Yes                                 +---------+---------------+---------+-----------+----------+--------------+ PTV      Full                                                         +---------+---------------+---------+-----------+----------+--------------+ PERO     Full                                                        +---------+---------------+---------+-----------+----------+--------------+   +---------+---------------+---------+-----------+----------+--------------+ LEFT     CompressibilityPhasicitySpontaneityPropertiesThrombus Aging +---------+---------------+---------+-----------+----------+--------------+ CFV      Full           Yes      Yes                                 +---------+---------------+---------+-----------+----------+--------------+ SFJ      Full                                                        +---------+---------------+---------+-----------+----------+--------------+ FV Prox  Full                                                        +---------+---------------+---------+-----------+----------+--------------+ FV Mid   Full                                                        +---------+---------------+---------+-----------+----------+--------------+ FV DistalFull                                                        +---------+---------------+---------+-----------+----------+--------------+ PFV      Full                                                        +---------+---------------+---------+-----------+----------+--------------+  POP      Full           Yes      Yes                                 +---------+---------------+---------+-----------+----------+--------------+ PTV      Full                                                        +---------+---------------+---------+-----------+----------+--------------+ PERO     Full                                                        +---------+---------------+---------+-----------+----------+--------------+     Summary: RIGHT: - There is no evidence of deep vein  thrombosis in the lower extremity.  - No cystic structure found in the popliteal fossa.  LEFT: - There is no evidence of deep vein thrombosis in the lower extremity.  - No cystic structure found in the popliteal fossa.  *See table(s) above for measurements and observations. Electronically signed by Harold Barban MD on 03/16/2022 at 10:37:58 PM.    Final    ECHOCARDIOGRAM LIMITED  Result Date: 03/15/2022    ECHOCARDIOGRAM LIMITED REPORT   Patient Name:   AIBHLINN KALMAR Date of Exam: 03/15/2022 Medical Rec #:  [id number]    Height:       62.0 in Accession #:    [id number]   Weight:       204.4 lb Date of Birth:  1990-11-11    BSA:          1.929 m Patient Age:    31 years     BP:           128/84 mmHg Patient Gender: F            HR:           92 bpm. Exam Location:  Inpatient Procedure: Limited Echo, Cardiac Doppler and Color Doppler STAT ECHO Indications:    I26.02 Pulmonary embolus  History:        Patient has no prior history of Echocardiogram examinations.                 Signs/Symptoms:Dyspnea, Chest Pain and Shortness of Breath.  Sonographer:    Roseanna Rainbow RDCS Referring Phys: [id number] JENNIFER YATES  Sonographer Comments: Technically difficult study due to poor echo windows. Image acquisition challenging due to patient body habitus. IMPRESSIONS  1. Left ventricular ejection fraction, by estimation, is 60 to 65%. The left ventricle has normal function. The left ventricle has no regional wall motion abnormalities. Left ventricular diastolic function could not be evaluated.  2. Right ventricular systolic function is normal. The right ventricular size is normal. Tricuspid regurgitation signal is inadequate for assessing PA pressure.  3. The mitral valve is normal in structure. No evidence of mitral valve regurgitation. No evidence of mitral stenosis.  4. The aortic valve is normal in structure. Aortic valve regurgitation is not visualized. No aortic stenosis is present.  5. The inferior vena cava is normal in size with  greater than 50% respiratory  variability, suggesting right atrial pressure of 3 mmHg. FINDINGS  Left Ventricle: Left ventricular ejection fraction, by estimation, is 60 to 65%. The left ventricle has normal function. The left ventricle has no regional wall motion abnormalities. The left ventricular internal cavity size was normal in size. There is  no left ventricular hypertrophy. Left ventricular diastolic function could not be evaluated. Right Ventricle: The right ventricular size is normal. No increase in right ventricular wall thickness. Right ventricular systolic function is normal. Tricuspid regurgitation signal is inadequate for assessing PA pressure. Left Atrium: Left atrial size was normal in size. Right Atrium: Right atrial size was normal in size. Pericardium: There is no evidence of pericardial effusion. Mitral Valve: The mitral valve is normal in structure. No evidence of mitral valve stenosis. Tricuspid Valve: The tricuspid valve is normal in structure. Tricuspid valve regurgitation is not demonstrated. No evidence of tricuspid stenosis. Aortic Valve: The aortic valve is normal in structure. Aortic valve regurgitation is not visualized. No aortic stenosis is present. Pulmonic Valve: The pulmonic valve was not well visualized. Pulmonic valve regurgitation is not visualized. No evidence of pulmonic stenosis. Aorta: The aortic root is normal in size and structure. Venous: The inferior vena cava is normal in size with greater than 50% respiratory variability, suggesting right atrial pressure of 3 mmHg. IAS/Shunts: No atrial level shunt detected by color flow Doppler. LEFT VENTRICLE PLAX 2D LVIDd:         4.30 cm LVIDs:         2.70 cm LV PW:         1.10 cm LV IVS:        0.90 cm LVOT diam:     2.10 cm LVOT Area:     3.46 cm  LV Volumes (MOD) LV vol d, MOD A2C: 67.2 ml LV vol d, MOD A4C: 87.3 ml LV vol s, MOD A2C: 28.2 ml LV vol s, MOD A4C: 36.6 ml LV SV MOD A2C:     39.0 ml LV SV MOD A4C:     87.3 ml LV  SV MOD BP:      45.3 ml RIGHT VENTRICLE            IVC RV S prime:     9.14 cm/s  IVC diam: 1.50 cm TAPSE (M-mode): 1.8 cm LEFT ATRIUM         Index       RIGHT ATRIUM          Index LA diam:    2.20 cm 1.14 cm/m  RA Area:     9.72 cm                                 RA Volume:   17.70 ml 9.17 ml/m   AORTA Ao Asc diam: 2.70 cm  SHUNTS Systemic Diam: 2.10 cm Dani Gobble Croitoru MD Electronically signed by Sanda Klein MD Signature Date/Time: 03/15/2022/11:20:25 AM    Final    CT Angio Chest PE W and/or Wo Contrast  Result Date: 03/14/2022 CLINICAL DATA:  Chest pain EXAM: CT ANGIOGRAPHY CHEST WITH CONTRAST TECHNIQUE: Multidetector CT imaging of the chest was performed using the standard protocol during bolus administration of intravenous contrast. Multiplanar CT image reconstructions and MIPs were obtained to evaluate the vascular anatomy. RADIATION DOSE REDUCTION: This exam was performed according to the departmental dose-optimization program which includes automated exposure control, adjustment of the mA and/or kV according to patient size  and/or use of iterative reconstruction technique. CONTRAST:  161mL OMNIPAQUE IOHEXOL 350 MG/ML SOLN COMPARISON:  None Available. FINDINGS: Cardiovascular: Contrast injection is sufficient to demonstrate satisfactory opacification of the pulmonary arteries to the segmental level. Acute pulmonary embolus with majority of the clot burden in the right lower lobar and proximal segmental arteries. There is evidence of right heart strain with the calculated RV/LV ratio of 1.07. small amount of clot in the lingular branches. The size of the main pulmonary artery is normal. Heart size is normal, with no pericardial effusion. The course and caliber of the aorta are normal. There is no atherosclerotic calcification. Opacification decreased due to pulmonary arterial phase contrast bolus timing. Mediastinum/Nodes: No mediastinal, hilar or axillary lymphadenopathy. Normal visualized thyroid.  Thoracic esophageal course is normal. Lungs/Pleura: Airways are patent. No pleural effusion, lobar consolidation, pneumothorax or pulmonary infarction. Upper Abdomen: Contrast bolus timing is not optimized for evaluation of the abdominal organs. The visualized portions of the organs of the upper abdomen are normal. Musculoskeletal: No chest wall abnormality. No bony spinal canal stenosis. Review of the MIP images confirms the above findings. IMPRESSION: 1. Acute pulmonary embolus with majority of the clot burden in the right lower lobar and proximal segmental arteries. Small amount of clot in the lingula branches. 2. CT findings of right heart strain (RV/LV Ratio = 1.07 ) consistent with at least submassive (intermediate risk) PE. The presence of right heart strain has been associated with an increased risk of morbidity and mortality. Critical Value/emergent results were called by telephone at the time of interpretation on 03/14/2022 at 7:03 pm to provider [facility] , who verbally acknowledged these results. Electronically Signed   By: Ulyses Jarred M.D.   On: 03/14/2022 19:24   CT Soft Tissue Neck W Contrast  Result Date: 03/14/2022 CLINICAL DATA:  Chest pain and shortness of breath EXAM: CT NECK WITH CONTRAST TECHNIQUE: Multidetector CT imaging of the neck was performed using the standard protocol following the bolus administration of intravenous contrast. RADIATION DOSE REDUCTION: This exam was performed according to the departmental dose-optimization program which includes automated exposure control, adjustment of the mA and/or kV according to patient size and/or use of iterative reconstruction technique. CONTRAST:  185mL OMNIPAQUE IOHEXOL 350 MG/ML SOLN COMPARISON:  None Available. FINDINGS: PHARYNX AND LARYNX: The nasopharynx, oropharynx and larynx are normal. Visible portions of the oral cavity, tongue base and floor of mouth are normal. Normal epiglottis, vallecula and pyriform sinuses. The  larynx is normal. No retropharyngeal abscess, effusion or lymphadenopathy. SALIVARY GLANDS: Normal parotid, submandibular and sublingual glands. THYROID: Heterogeneous and enlarged thyroid gland, left predominant with 1.8 cm left thyroid lobe nodule. LYMPH NODES: No enlarged or abnormal density lymph nodes. VASCULAR: Major cervical vessels are patent. LIMITED INTRACRANIAL: Normal. VISUALIZED ORBITS: Normal. MASTOIDS AND VISUALIZED PARANASAL SINUSES: No fluid levels or advanced mucosal thickening. No mastoid effusion. SKELETON: No bony spinal canal stenosis. No lytic or blastic lesions. UPPER CHEST: Clear. OTHER: None. IMPRESSION: 1. No acute abnormality of the neck. 2. Incidental heterogeneous and enlarged thyroid with 1.8 cm nodule in the left lobe. Recommend non-emergent thyroid ultrasound. Reference: J Am Coll Radiol. 2015 Feb;12(2): 143-50 Electronically Signed   By: Ulyses Jarred M.D.   On: 03/14/2022 19:07   DG Chest 2 View  Result Date: 03/14/2022 CLINICAL DATA:  Chest pain, short of breath EXAM: CHEST - 2 VIEW COMPARISON:  None Available. FINDINGS: Normal mediastinum and cardiac silhouette. Normal pulmonary vasculature. No evidence of effusion, infiltrate, or pneumothorax. No acute bony  abnormality. IMPRESSION: No acute cardiopulmonary process. Electronically Signed   By: Suzy Bouchard M.D.   On: 03/14/2022 15:38   DG Wrist Complete Right  Result Date: 02/20/2022 CLINICAL DATA:  Wrist pain with swelling. EXAM: RIGHT WRIST - COMPLETE 3+ VIEW COMPARISON:  None Available. FINDINGS: There is no evidence of fracture or dislocation. There is no evidence of arthropathy or other focal bone abnormality. Soft tissues are unremarkable. IMPRESSION: Negative. Electronically Signed   By: Ronney Asters M.D.   On: 02/20/2022 18:49    Microbiology: Results for orders placed or performed during the hospital encounter of 08/30/20  SARS CORONAVIRUS 2 (TAT 6-24 HRS) Nasopharyngeal Nasopharyngeal Swab     Status:  None   Collection Time: 08/30/20  7:00 PM   Specimen: Nasopharyngeal Swab  Result Value Ref Range Status   SARS Coronavirus 2 NEGATIVE NEGATIVE Final    Comment: (NOTE) SARS-CoV-2 target nucleic acids are NOT DETECTED.  The SARS-CoV-2 RNA is generally detectable in upper and lower respiratory specimens during the acute phase of infection. Negative results do not preclude SARS-CoV-2 infection, do not rule out co-infections with other pathogens, and should not be used as the sole basis for treatment or other patient management decisions. Negative results must be combined with clinical observations, patient history, and epidemiological information. The expected result is Negative.  Fact Sheet for Patients: SugarRoll.be  Fact Sheet for Healthcare Providers: https://www.woods-mathews.com/  This test is not yet approved or cleared by the [place] FDA and  has been authorized for detection and/or diagnosis of SARS-CoV-2 by FDA under an Emergency Use Authorization (EUA). This EUA will remain  in effect (meaning this test can be used) for the duration of the COVID-19 declaration under Se ction 564(b)(1) of the Act, 21 U.S.C. section 360bbb-3(b)(1), unless the authorization is terminated or revoked sooner.  Performed at [facility], [place] [address] [place], [place] [address]     Labs: CBC: Recent Labs  Lab 03/14/22 1632 03/16/22 0055 03/17/22 0058 03/18/22 0047 03/19/22 0052  WBC 8.4 7.8 8.7 6.0 5.3  HGB 13.3 11.1* 10.8* 10.9* 10.8*  HCT 41.3 35.1* 32.9* 34.1* 34.1*  MCV 74.0* 75.8* 74.3* 74.9* 75.8*  PLT 326 296 273 291 [id number]   Basic Metabolic Panel: Recent Labs  Lab 03/14/22 1632 03/17/22 0058 03/18/22 0047  NA 137 134* 139  K 3.6 3.2* 4.2  CL 101 102 107  CO2 26 25 25   GLUCOSE 83 122* 116*  BUN 6 9 8   CREATININE 0.91 1.08* 0.83  CALCIUM 9.2 8.5* 8.9   Liver Function Tests: Recent Labs  Lab 03/14/22 1632   AST 26  ALT 20  ALKPHOS 99  BILITOT 0.7  PROT 9.1*  ALBUMIN 4.0   CBG: No results for input(s): "GLUCAP" in the last 168 hours.  Discharge time spent: greater than 30 minutes.  Signed: Tawni Millers, MD [facility] 03/19/2022

## 2022-03-19 NOTE — Progress Notes (Signed)
Nurse requested Mobility Specialist to perform oxygen saturation test with pt which includes removing pt from oxygen both at rest and while ambulating.  Below are the results from that testing.     Patient Saturations on Room Air at Rest = spO2 97%  Patient Saturations on Room Air while Ambulating = sp02 93% .    Patient Saturations on n/a Liters of oxygen while Ambulating = sp02 n/a%  Reported results to nurse.

## 2022-03-19 NOTE — TOC Transition Note (Signed)
Transition of Care [facility]) - CM/SW Discharge Note   Patient Details  Name: Terisa Belardo MRN: [id number] Date of Birth: Nov 17, 1990  Transition of Care [facility]) CM/SW Contact:  Bartholomew Crews, RN Phone Number: [phone] 03/19/2022, 11:57 AM   Clinical Narrative:     Patient to transition home today. No home oxygen needs. Will be taking Eliquis at discharge. Provided 30-day free trial card. Noted patient has managed Medicaid - likely copay for refills is $4. No further TOC needs identified at this time.   Final next level of care: Home/Self Care Barriers to Discharge: No Barriers Identified   Patient Goals and CMS Choice Patient states their goals for this hospitalization and ongoing recovery are:: to return to home CMS Medicare.gov Compare Post Acute Care list provided to:: Patient Choice offered to / list presented to : NA  Discharge Placement                       Discharge Plan and Services In-house Referral: NA Discharge Planning Services: CM Consult Post Acute Care Choice: Durable Medical Equipment          DME Arranged: N/A DME Agency: NA       HH Arranged: NA HH Agency: NA        Social Determinants of Health (SDOH) Interventions     Readmission Risk Interventions     No data to display

## 2022-07-02 ENCOUNTER — Emergency Department (HOSPITAL_BASED_OUTPATIENT_CLINIC_OR_DEPARTMENT_OTHER): Payer: Medicaid Other

## 2022-07-02 ENCOUNTER — Encounter (HOSPITAL_BASED_OUTPATIENT_CLINIC_OR_DEPARTMENT_OTHER): Payer: Self-pay | Admitting: Emergency Medicine

## 2022-07-02 ENCOUNTER — Other Ambulatory Visit: Payer: Self-pay

## 2022-07-02 DIAGNOSIS — Z7901 Long term (current) use of anticoagulants: Secondary | ICD-10-CM | POA: Insufficient documentation

## 2022-07-02 DIAGNOSIS — Z7951 Long term (current) use of inhaled steroids: Secondary | ICD-10-CM | POA: Insufficient documentation

## 2022-07-02 DIAGNOSIS — E041 Nontoxic single thyroid nodule: Secondary | ICD-10-CM | POA: Diagnosis not present

## 2022-07-02 DIAGNOSIS — R079 Chest pain, unspecified: Secondary | ICD-10-CM | POA: Insufficient documentation

## 2022-07-02 DIAGNOSIS — J45909 Unspecified asthma, uncomplicated: Secondary | ICD-10-CM | POA: Diagnosis not present

## 2022-07-02 DIAGNOSIS — G8929 Other chronic pain: Secondary | ICD-10-CM | POA: Insufficient documentation

## 2022-07-02 LAB — PREGNANCY, URINE: Preg Test, Ur: NEGATIVE

## 2022-07-02 NOTE — ED Triage Notes (Signed)
Pt reports continued CP since Oct when she was dx w/ bil PEs

## 2022-07-03 ENCOUNTER — Emergency Department (HOSPITAL_BASED_OUTPATIENT_CLINIC_OR_DEPARTMENT_OTHER): Payer: Medicaid Other

## 2022-07-03 ENCOUNTER — Emergency Department (HOSPITAL_BASED_OUTPATIENT_CLINIC_OR_DEPARTMENT_OTHER)
Admission: EM | Admit: 2022-07-03 | Discharge: 2022-07-03 | Disposition: A | Discharge status: Home / Self Care | Payer: Medicaid Other | Attending: Emergency Medicine | Admitting: Emergency Medicine

## 2022-07-03 ENCOUNTER — Encounter (HOSPITAL_BASED_OUTPATIENT_CLINIC_OR_DEPARTMENT_OTHER): Payer: Self-pay

## 2022-07-03 DIAGNOSIS — G8929 Other chronic pain: Secondary | ICD-10-CM

## 2022-07-03 DIAGNOSIS — E041 Nontoxic single thyroid nodule: Secondary | ICD-10-CM

## 2022-07-03 HISTORY — DX: Other pulmonary embolism without acute cor pulmonale: I26.99

## 2022-07-03 LAB — CBC WITH DIFFERENTIAL/PLATELET
Abs Immature Granulocytes: 0.01 10*3/uL (ref 0.00–0.07)
Basophils Absolute: 0 10*3/uL (ref 0.0–0.1)
Basophils Relative: 0 %
Eosinophils Absolute: 0.1 10*3/uL (ref 0.0–0.5)
Eosinophils Relative: 1 %
HCT: 41 % (ref 36.0–46.0)
Hemoglobin: 13.4 g/dL (ref 12.0–15.0)
Immature Granulocytes: 0 %
Lymphocytes Relative: 24 %
Lymphs Abs: 1.4 10*3/uL (ref 0.7–4.0)
MCH: 24.4 pg — ABNORMAL LOW (ref 26.0–34.0)
MCHC: 32.7 g/dL (ref 30.0–36.0)
MCV: 74.5 fL — ABNORMAL LOW (ref 80.0–100.0)
Monocytes Absolute: 0.2 10*3/uL (ref 0.1–1.0)
Monocytes Relative: 3 %
Neutro Abs: 4.1 10*3/uL (ref 1.7–7.7)
Neutrophils Relative %: 72 %
Platelets: 348 10*3/uL (ref 150–400)
RBC: 5.5 MIL/uL — ABNORMAL HIGH (ref 3.87–5.11)
RDW: 16.4 % — ABNORMAL HIGH (ref 11.5–15.5)
WBC: 5.8 10*3/uL (ref 4.0–10.5)
nRBC: 0 % (ref 0.0–0.2)

## 2022-07-03 LAB — BASIC METABOLIC PANEL
Anion gap: 8 (ref 5–15)
BUN: 7 mg/dL (ref 6–20)
CO2: 22 mmol/L (ref 22–32)
Calcium: 8.8 mg/dL — ABNORMAL LOW (ref 8.9–10.3)
Chloride: 103 mmol/L (ref 98–111)
Creatinine, Ser: 0.7 mg/dL (ref 0.44–1.00)
GFR, Estimated: >60 mL/min (ref >60)
Glucose, Bld: 111 mg/dL — ABNORMAL HIGH (ref 70–99)
Potassium: 3.8 mmol/L (ref 3.5–5.1)
Sodium: 133 mmol/L — ABNORMAL LOW (ref 135–145)

## 2022-07-03 LAB — TROPONIN I (HIGH SENSITIVITY): Troponin I (High Sensitivity): <2 ng/L (ref <18)

## 2022-07-03 MED ORDER — IOHEXOL 350 MG/ML SOLN
75.0000 mL | Freq: Once | INTRAVENOUS | Status: AC | PRN
  Administered 2022-07-03: 75 mL via INTRAVENOUS

## 2022-07-03 NOTE — ED Provider Notes (Signed)
[facility] Provider Note: Andrea Dell, MD, FACEP  CSN: [id number] MRN: [id number] ARRIVAL: 07/02/22 at 2201 ROOM: MH01/MH01   CHIEF COMPLAINT  Chest Pain   HISTORY OF PRESENT ILLNESS  07/03/22 1:59 AM Andrea Preston is a 32 y.o. female with a history of asthma and pulmonary embolism.  She is here with left upper chest pain that has been present since she was diagnosed with pulmonary emboli in October 2023.  The pain worsened yesterday.  She rates it as an 8 out of 10.  It is worse with breathing but she is not short of breath.  She describes it as a squeezing pain.   Past Medical History:  Diagnosis Date   Asthma    Pulmonary embolism (HCC)     Past Surgical History:  Procedure Laterality Date   HERNIA REPAIR     x2   OVARIAN CYST REMOVAL      Family History  Problem Relation Age of Onset   Deep vein thrombosis Neg Hx     Social History   Tobacco Use   Smoking status: Never   Smokeless tobacco: Never  Vaping Use   Vaping Use: Never used  Substance Use Topics   Alcohol use: Not Currently    Comment: occ   Drug use: Not Currently    Prior to Admission medications   Medication Sig Start Date End Date Taking? Authorizing Provider  acetaminophen (TYLENOL) 500 MG tablet Take 1,000 mg by mouth every 6 (six) hours as needed for mild pain or headache.    [provider]  albuterol (VENTOLIN HFA) 108 (90 Base) MCG/ACT inhaler Inhale 1 puff into the lungs every 4 (four) hours as needed for wheezing or shortness of breath. 03/19/22   Arrien, York Ram, MD  apixaban (ELIQUIS) 5 MG TABS tablet Take 2 tablets (10 mg total) by mouth 2 (two) times daily for 2 days, THEN 1 tablet (5 mg total) 2 (two) times daily for 28 days. 03/19/22 04/18/22  Arrien, York Ram, MD  cyclobenzaprine (FLEXERIL) 5 MG tablet Take 1 tablet (5 mg total) by mouth 3 (three) times daily as needed for muscle spasms (neck pain.). 03/19/22   Arrien, York Ram, MD  ferrous  sulfate 325 (65 FE) MG tablet Take 1 tablet (325 mg total) by mouth daily with breakfast. 03/20/22 04/19/22  Arrien, York Ram, MD  polyethylene glycol (MIRALAX / GLYCOLAX) 17 g packet Take 17 g by mouth daily as needed for mild constipation. 03/19/22   Arrien, York Ram, MD    Allergies Patient has no known allergies.   REVIEW OF SYSTEMS  Negative except as noted here or in the History of Present Illness.   PHYSICAL EXAMINATION  Initial Vital Signs Blood pressure 124/74, pulse 76, temperature 98.4 F (36.9 C), temperature source Oral, resp. rate (!) 27, height 5\' 1"  (1.549 m), weight 90.9 kg, last menstrual period 06/25/2022, SpO2 100 %, not currently breastfeeding.  Examination General: Well-developed, well-nourished female in no acute distress; appearance consistent with age of record HENT: normocephalic; atraumatic Eyes: Normal appearance Neck: supple Heart: regular rate and rhythm Lungs: clear to auscultation bilaterally Abdomen: soft; nondistended; nontender; bowel sounds present Extremities: No deformity; full range of motion; pulses normal; no edema Neurologic: Awake, alert and oriented; motor function intact in all extremities and symmetric; no facial droop Skin: Warm and dry Psychiatric: Normal mood and affect   RESULTS  Summary of this visit's results, reviewed and interpreted by myself:   EKG Interpretation  Date/Time:  Ventricular Rate:    PR Interval:    QRS Duration:   QT Interval:    QTC Calculation:   R Axis:     Text Interpretation:         Laboratory Studies: Results for orders placed or performed during the hospital encounter of 07/03/22 (from the past 24 hour(s))  Pregnancy, urine     Status: None   Collection Time: 07/02/22 10:27 PM  Result Value Ref Range   Preg Test, Ur NEGATIVE NEGATIVE  CBC with Differential     Status: Abnormal   Collection Time: 07/03/22 12:29 AM  Result Value Ref Range   WBC 5.8 4.0 - 10.5 K/uL   RBC  5.50 (H) 3.87 - 5.11 MIL/uL   Hemoglobin 13.4 12.0 - 15.0 g/dL   HCT 41.0 36.0 - 46.0 %   MCV 74.5 (L) 80.0 - 100.0 fL   MCH 24.4 (L) 26.0 - 34.0 pg   MCHC 32.7 30.0 - 36.0 g/dL   RDW 16.4 (H) 11.5 - 15.5 %   Platelets 348 150 - 400 K/uL   nRBC 0.0 0.0 - 0.2 %   Neutrophils Relative % 72 %   Neutro Abs 4.1 1.7 - 7.7 K/uL   Lymphocytes Relative 24 %   Lymphs Abs 1.4 0.7 - 4.0 K/uL   Monocytes Relative 3 %   Monocytes Absolute 0.2 0.1 - 1.0 K/uL   Eosinophils Relative 1 %   Eosinophils Absolute 0.1 0.0 - 0.5 K/uL   Basophils Relative 0 %   Basophils Absolute 0.0 0.0 - 0.1 K/uL   Immature Granulocytes 0 %   Abs Immature Granulocytes 0.01 0.00 - 0.07 K/uL  Troponin I (High Sensitivity)     Status: None   Collection Time: 07/03/22 12:36 AM  Result Value Ref Range   Troponin I (High Sensitivity) <2 <18 ng/L  Basic metabolic panel     Status: Abnormal   Collection Time: 07/03/22  2:14 AM  Result Value Ref Range   Sodium 133 (L) 135 - 145 mmol/L   Potassium 3.8 3.5 - 5.1 mmol/L   Chloride 103 98 - 111 mmol/L   CO2 22 22 - 32 mmol/L   Glucose, Bld 111 (H) 70 - 99 mg/dL   BUN 7 6 - 20 mg/dL   Creatinine, Ser 0.70 0.44 - 1.00 mg/dL   Calcium 8.8 (L) 8.9 - 10.3 mg/dL   GFR, Estimated >60 >60 mL/min   Anion gap 8 5 - 15   Imaging Studies: CT Angio Chest PE W and/or Wo Contrast  Result Date: 07/03/2022 CLINICAL DATA:  Pulmonary embolism (PE) suspected, high prob h/o PEs. Chest pain EXAM: CT ANGIOGRAPHY CHEST WITH CONTRAST TECHNIQUE: Multidetector CT imaging of the chest was performed using the standard protocol during bolus administration of intravenous contrast. Multiplanar CT image reconstructions and MIPs were obtained to evaluate the vascular anatomy. RADIATION DOSE REDUCTION: This exam was performed according to the departmental dose-optimization program which includes automated exposure control, adjustment of the mA and/or kV according to patient size and/or use of iterative  reconstruction technique. CONTRAST:  48mL OMNIPAQUE IOHEXOL 350 MG/ML SOLN COMPARISON:  03/14/2022 FINDINGS: Cardiovascular: Adequate opacification of the pulmonary arterial tree. No intraluminal filling defect identified to suggest acute pulmonary embolism. Previously noted pulmonary emboli have resolved in the interval. Central pulmonary arteries are of normal caliber. No significant coronary artery calcification. Cardiac size within normal limits. No pericardial effusion. No significant atherosclerotic calcification within the thoracic aorta. No aortic aneurysm. Mediastinum/Nodes: Asymmetric enlargement of  the left thyroid lobe with heterogeneously enhancing 2.7 cm thyroid nodule again identified. No pathologic thoracic adenopathy. Esophagus unremarkable. Lungs/Pleura: Mild lingular atelectasis. Lungs are otherwise clear. No pneumothorax or pleural effusion. Central airways are widely patent. Upper Abdomen: No acute abnormality. Musculoskeletal: No chest wall abnormality. No acute or significant osseous findings. Review of the MIP images confirms the above findings. IMPRESSION: 1. No pulmonary embolism. Interval resolution of previously noted pulmonary emboli. 2. Asymmetric enlargement of the left thyroid lobe with heterogeneously enhancing 2.7 cm thyroid nodule again identified. Recommend thyroid US (ref: J Am Coll Radiol. 2015 Feb;12(2): 143-50). Electronically Signed   By: Fidela Salisbury M.D.   On: 07/03/2022 03:23   DG Chest 2 View  Result Date: 07/02/2022 CLINICAL DATA:  Chest pain. EXAM: CHEST - 2 VIEW COMPARISON:  March 14, 2022 FINDINGS: The heart size and mediastinal contours are within normal limits. Both lungs are clear. The visualized skeletal structures are unremarkable. IMPRESSION: No active cardiopulmonary disease. Electronically Signed   By: Virgina Norfolk M.D.   On: 07/02/2022 22:37    ED COURSE and MDM  Nursing notes, initial and subsequent vitals signs, including pulse oximetry,  reviewed and interpreted by myself.  Vitals:   07/02/22 2207 07/03/22 0010 07/03/22 0100 07/03/22 0200  BP: (!) 143/88 (!) 143/86 124/74 123/81  Pulse: 93 81 76 (!) 58  Resp: 16 16 (!) 27 17  Temp: 98.4 F (36.9 C)   98.4 F (36.9 C)  TempSrc: Oral     SpO2: 100% 99% 100% 100%  Weight:      Height:       Medications  iohexol (OMNIPAQUE) 350 MG/ML injection 75 mL (75 mLs Intravenous Contrast Given 07/03/22 0306)   The cause of the patient's chronic chest pain is unclear but could be due to scarring from previous pulmonary emboli.  No evidence of recurrent pulmonary emboli.  She was advised of the thyroid nodule and the need to have this followed up with ultrasound.  She has a primary care physician with whom she can follow-up.   PROCEDURES  Procedures   ED DIAGNOSES     ICD-10-CM   1. Chronic chest pain  R07.9    G89.29     2. Thyroid nodule  E04.1          Shalene Gallen, Jenny Reichmann, MD 07/03/22 [phone]

## 2022-10-05 IMAGING — US US OB COMP LESS 14 WK
1 series · 14 of 28 positions shown · non-contrast
Comparison: None.

CLINICAL DATA: Pregnant, right-sided pain

EXAM:
OBSTETRIC <14 WK US AND TRANSVAGINAL OB US
TECHNIQUE: Both transabdominal and transvaginal ultrasound examinations were
performed for complete evaluation of the gestation as well as the
maternal uterus, adnexal regions, and pelvic cul-de-sac.
Transvaginal technique was performed to assess early pregnancy.

[Series 1: us ob comp less 14 wk · 64 acquisitions, 14 frames shown]
[im 3/64]
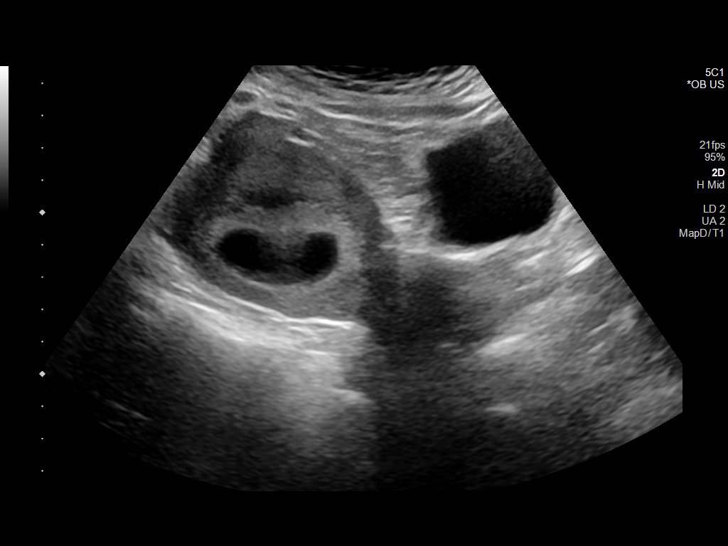
[im 8/64]
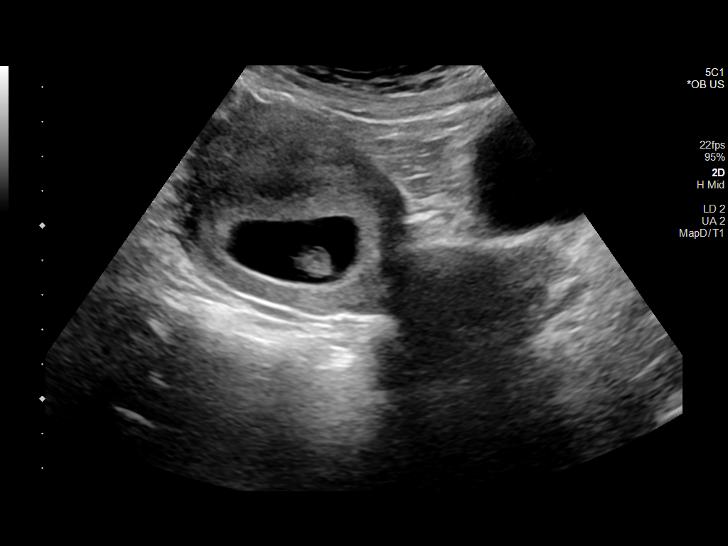
[im 12/64]
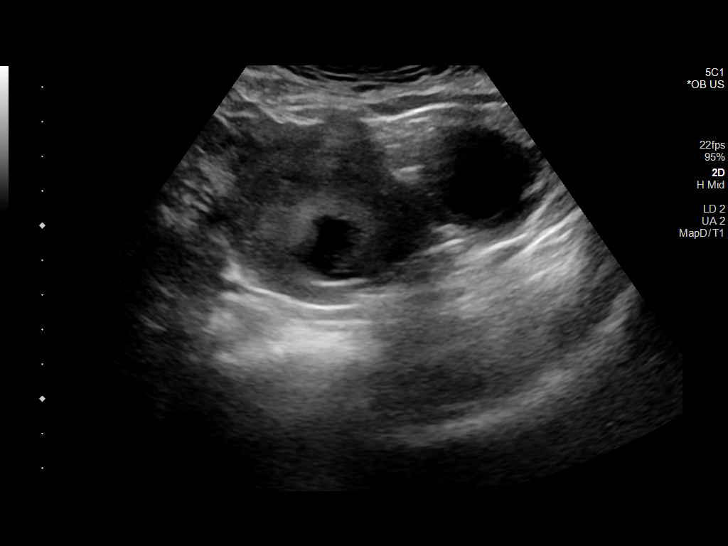
[im 17/64]
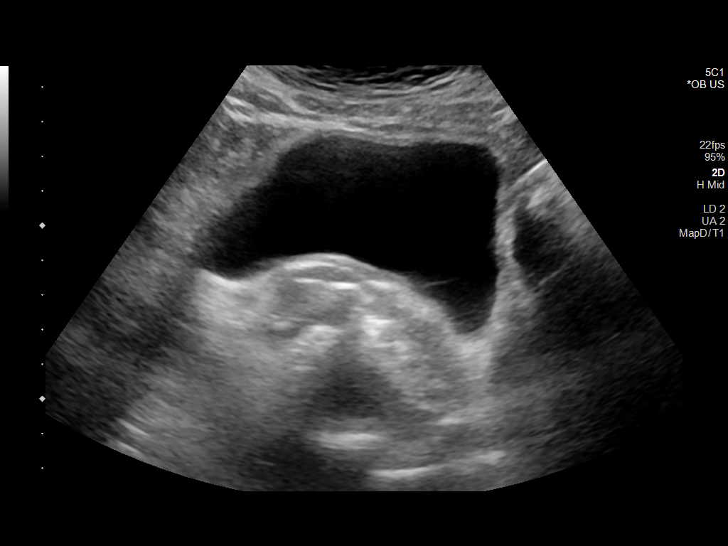
[im 22/64]
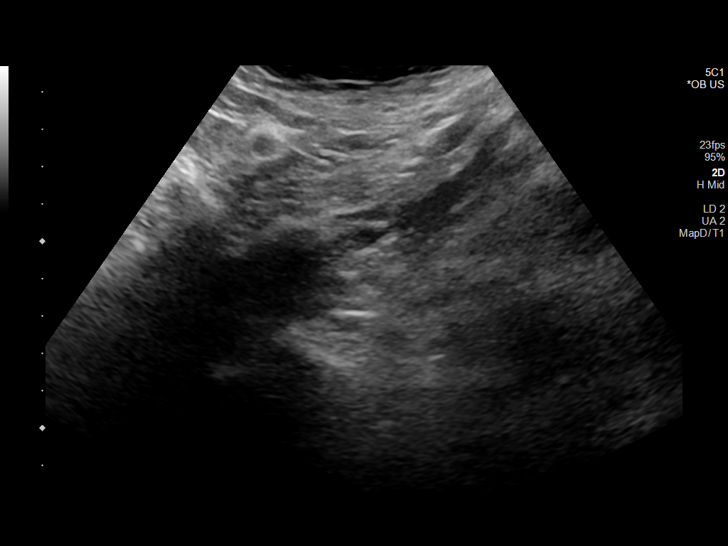
[im 26/64]
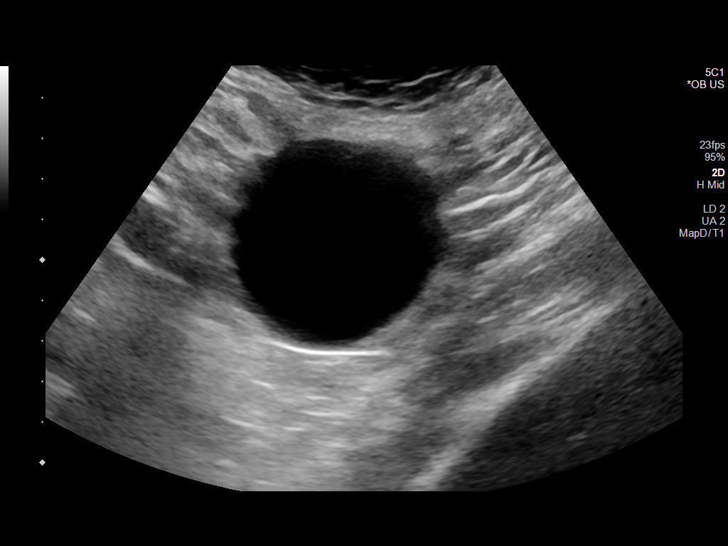
[im 31/64]
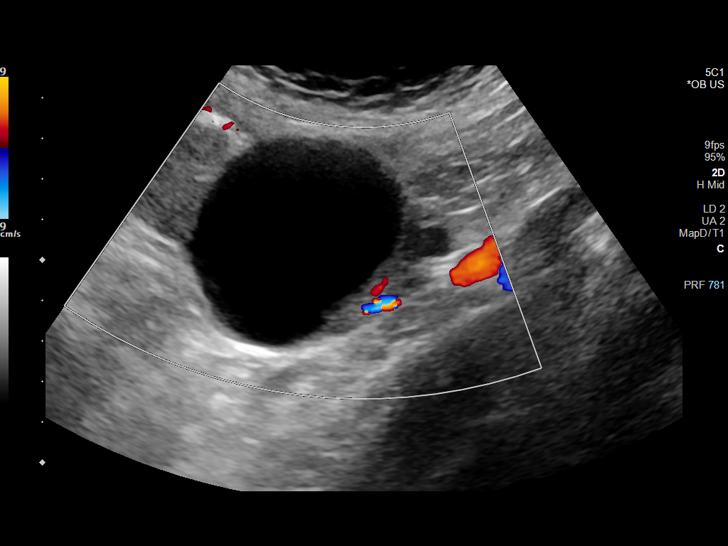
[im 36/64]
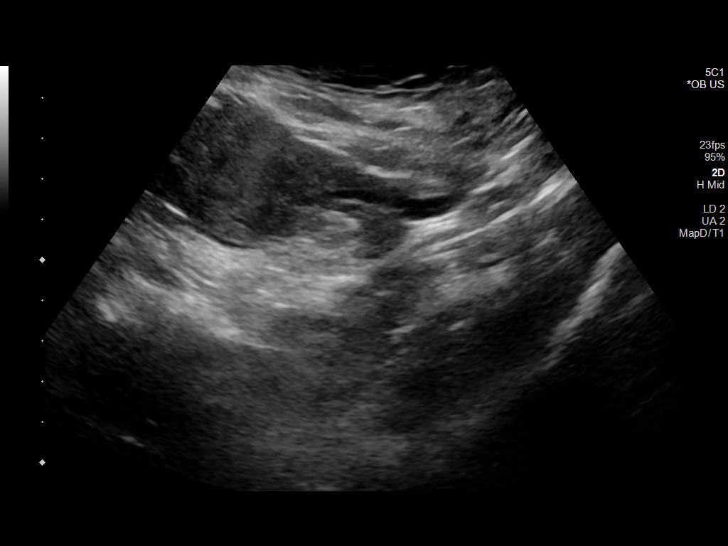
[im 40/64]
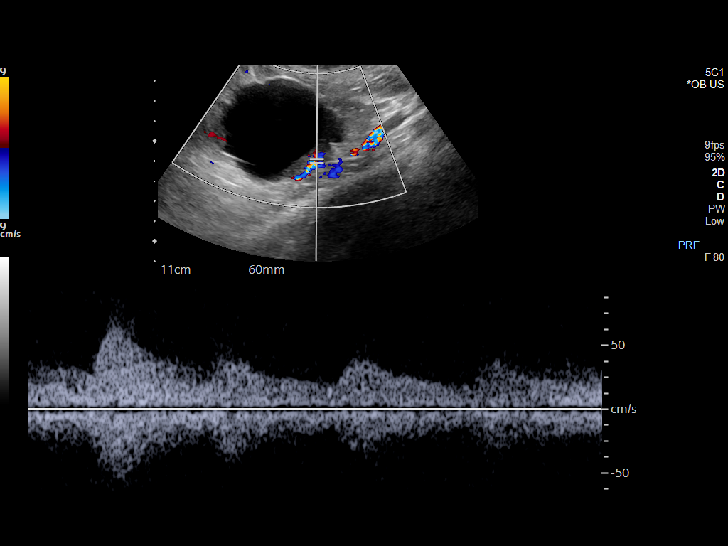
[im 45/64]
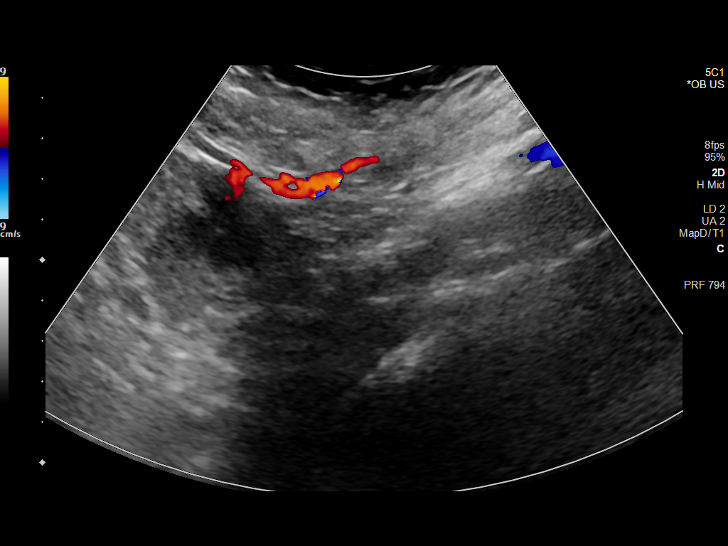
[im 50/64]
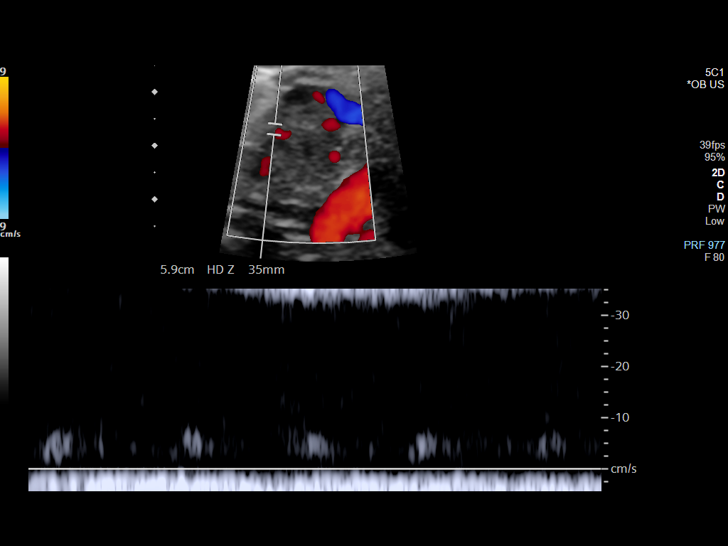
[im 54/64]
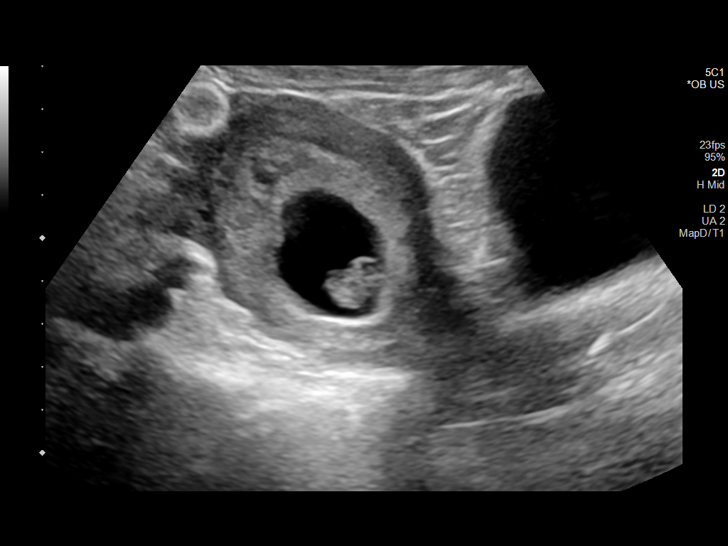
[im 59/64]
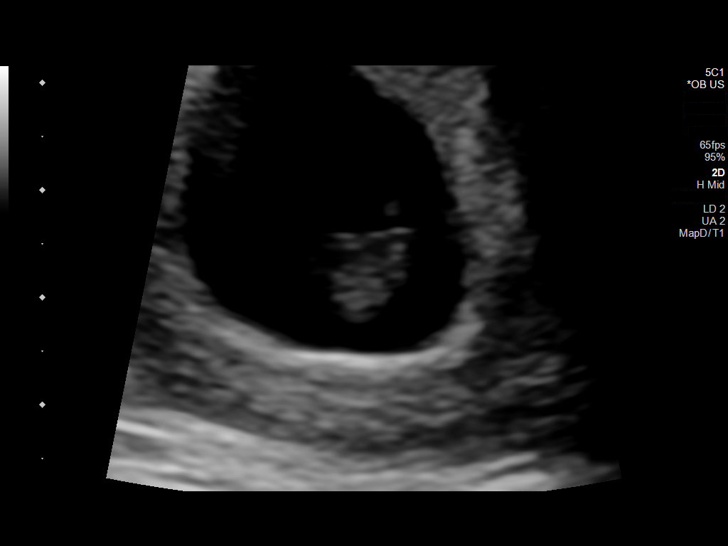
[im 64/64]
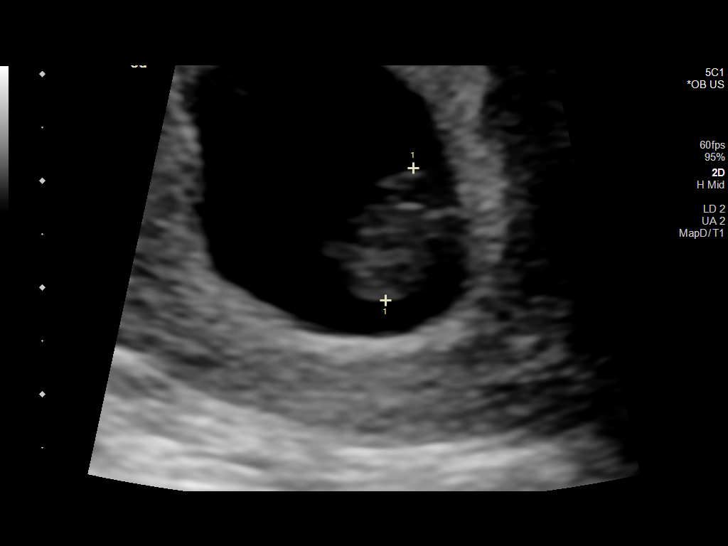

[14 of 28 positions shown; findings below may reference images not displayed]

FINDINGS: Intrauterine gestational sac: Single

Yolk sac:  Visualized.

Embryo:  Visualized.

Cardiac Activity: Visualized.

Heart Rate: 175 bpm

CRL:  12.7 mm   7 w   3 d                  US EDC: 02/28/2022

Subchorionic hemorrhage:  Small subchronic hemorrhage.

Maternal uterus/adnexae: Right ovary is within normal limits.

Left ovary is notable for a 5.3 x 4.4 x 5.0 cm simple cyst.

No free fluid.
IMPRESSION: Single intrauterine gestation with cardiac activity, measuring 7
weeks 3 days by crown-rump length, as above.

## 2023-09-05 ENCOUNTER — Other Ambulatory Visit: Payer: Self-pay

## 2023-09-05 ENCOUNTER — Encounter (HOSPITAL_BASED_OUTPATIENT_CLINIC_OR_DEPARTMENT_OTHER): Payer: Self-pay

## 2023-09-05 ENCOUNTER — Emergency Department (HOSPITAL_BASED_OUTPATIENT_CLINIC_OR_DEPARTMENT_OTHER)

## 2023-09-05 ENCOUNTER — Emergency Department (HOSPITAL_BASED_OUTPATIENT_CLINIC_OR_DEPARTMENT_OTHER)
Admission: EM | Admit: 2023-09-05 | Discharge: 2023-09-05 | Disposition: A | Discharge status: Home / Self Care | Attending: Emergency Medicine | Admitting: Emergency Medicine

## 2023-09-05 DIAGNOSIS — E041 Nontoxic single thyroid nodule: Secondary | ICD-10-CM | POA: Diagnosis not present

## 2023-09-05 DIAGNOSIS — R0789 Other chest pain: Secondary | ICD-10-CM | POA: Insufficient documentation

## 2023-09-05 DIAGNOSIS — J45909 Unspecified asthma, uncomplicated: Secondary | ICD-10-CM | POA: Diagnosis not present

## 2023-09-05 LAB — CBC
HCT: 39.2 % (ref 36.0–46.0)
Hemoglobin: 13.5 g/dL (ref 12.0–15.0)
MCH: 27.5 pg (ref 26.0–34.0)
MCHC: 34.4 g/dL (ref 30.0–36.0)
MCV: 79.8 fL — ABNORMAL LOW (ref 80.0–100.0)
Platelets: 263 10*3/uL (ref 150–400)
RBC: 4.91 MIL/uL (ref 3.87–5.11)
RDW: 14 % (ref 11.5–15.5)
WBC: 4.8 10*3/uL (ref 4.0–10.5)
nRBC: 0 % (ref 0.0–0.2)

## 2023-09-05 LAB — BASIC METABOLIC PANEL
Anion gap: 8 (ref 5–15)
BUN: 9 mg/dL (ref 6–20)
CO2: 22 mmol/L (ref 22–32)
Calcium: 8.9 mg/dL (ref 8.9–10.3)
Chloride: 106 mmol/L (ref 98–111)
Creatinine, Ser: 0.78 mg/dL (ref 0.44–1.00)
GFR, Estimated: >60 mL/min (ref >60)
Glucose, Bld: 107 mg/dL — ABNORMAL HIGH (ref 70–99)
Potassium: 3.6 mmol/L (ref 3.5–5.1)
Sodium: 136 mmol/L (ref 135–145)

## 2023-09-05 LAB — HCG, SERUM, QUALITATIVE: Preg, Serum: NEGATIVE

## 2023-09-05 LAB — TROPONIN I (HIGH SENSITIVITY): Troponin I (High Sensitivity): <2 ng/L (ref <18)

## 2023-09-05 MED ORDER — IOHEXOL 350 MG/ML SOLN
100.0000 mL | Freq: Once | INTRAVENOUS | Status: AC | PRN
  Administered 2023-09-05: 100 mL via INTRAVENOUS

## 2023-09-05 NOTE — ED Provider Notes (Signed)
 Andrea Preston Provider Note   CSN: [id number] Arrival date & time: 09/05/23  4098     History  Chief Complaint  Patient presents with   Chest Pain    Andrea Preston is a 33 y.o. female with history of a pulmonary embolism, asthma, presents with concern for chest pain ongoing for past 3 days.  States the pain is located in the center of her chest, no radiation to the back, arms, or jaw.  States the pain worsened slightly with movement and inspiration.  She is not on any anticoagulation.  States she recently had carpal tunnel surgery on 07/27/2023.  Denies any cough, fever, or chills.   Chest Pain      Home Medications Prior to Admission medications   Medication Sig Start Date End Date Taking? Authorizing Provider  acetaminophen (TYLENOL) 500 MG tablet Take 1,000 mg by mouth every 6 (six) hours as needed for mild pain or headache.    [provider]  albuterol (VENTOLIN HFA) 108 (90 Base) MCG/ACT inhaler Inhale 1 puff into the lungs every 4 (four) hours as needed for wheezing or shortness of breath. 03/19/22   Arrien, York Ram, MD  apixaban (ELIQUIS) 5 MG TABS tablet Take 2 tablets (10 mg total) by mouth 2 (two) times daily for 2 days, THEN 1 tablet (5 mg total) 2 (two) times daily for 28 days. 03/19/22 04/18/22  Arrien, York Ram, MD  cyclobenzaprine (FLEXERIL) 5 MG tablet Take 1 tablet (5 mg total) by mouth 3 (three) times daily as needed for muscle spasms (neck pain.). 03/19/22   Arrien, York Ram, MD  ferrous sulfate 325 (65 FE) MG tablet Take 1 tablet (325 mg total) by mouth daily with breakfast. 03/20/22 04/19/22  Arrien, York Ram, MD  polyethylene glycol (MIRALAX / GLYCOLAX) 17 g packet Take 17 g by mouth daily as needed for mild constipation. 03/19/22   Arrien, York Ram, MD      Allergies    Patient has no known allergies.    Review of Systems   Review of Systems  Cardiovascular:  Positive for  chest pain.    Physical Exam Updated Vital Signs BP 124/76 (BP Location: Left Arm)   Pulse 75   Temp 98.3 F (36.8 C)   Resp 18   Ht 5\' 2"  (1.575 m)   Wt 98.4 kg   SpO2 99%   BMI 39.69 kg/m  Physical Exam Vitals and nursing note reviewed.  Constitutional:      General: She is not in acute distress.    Appearance: She is well-developed. She is obese.  HENT:     Head: Normocephalic and atraumatic.  Eyes:     Conjunctiva/sclera: Conjunctivae normal.  Cardiovascular:     Rate and Rhythm: Normal rate and regular rhythm.     Heart sounds: No murmur heard.    Comments: Radial pulse 2+ bilaterally Pulmonary:     Effort: Pulmonary effort is normal. No respiratory distress.     Breath sounds: Normal breath sounds.  Abdominal:     Palpations: Abdomen is soft.     Tenderness: There is no abdominal tenderness.  Musculoskeletal:        General: No swelling.     Cervical back: Neck supple.  Skin:    General: Skin is warm and dry.     Capillary Refill: Capillary refill takes less than 2 seconds.  Neurological:     Mental Status: She is alert.  Psychiatric:  Mood and Affect: Mood normal.     ED Results / Procedures / Treatments   Labs (all labs ordered are listed, but only abnormal results are displayed) Labs Reviewed  BASIC METABOLIC PANEL - Abnormal; Notable for the following components:      Result Value   Glucose, Bld 107 (*)    All other components within normal limits  CBC - Abnormal; Notable for the following components:   MCV 79.8 (*)    All other components within normal limits  HCG, SERUM, QUALITATIVE  TROPONIN I (HIGH SENSITIVITY)    EKG EKG Interpretation Date/Time:  Tuesday September 05 2023 09:58:14 EDT Ventricular Rate:  87 PR Interval:  148 QRS Duration:  80 QT Interval:  376 QTC Calculation: 453 R Axis:   36  Text Interpretation: Sinus rhythm Borderline T abnormalities, anterior leads No significant change since last tracing Confirmed by  Ernie Avena ([id number]) on 09/05/2023 10:16:29 AM  Radiology CT Angio Chest PE W and/or Wo Contrast Result Date: 09/05/2023 CLINICAL DATA:  Chest pain for the past 3 days. Denies shortness of breath. EXAM: CT ANGIOGRAPHY CHEST WITH CONTRAST TECHNIQUE: Multidetector CT imaging of the chest was performed using the standard protocol during bolus administration of intravenous contrast. Multiplanar CT image reconstructions and MIPs were obtained to evaluate the vascular anatomy. RADIATION DOSE REDUCTION: This exam was performed according to the departmental dose-optimization program which includes automated exposure control, adjustment of the mA and/or kV according to patient size and/or use of iterative reconstruction technique. CONTRAST:  OMNIPAQUE IOHEXOL 350 MG/ML SOLN COMPARISON:  CT chest dated July 03, 2022. FINDINGS: Cardiovascular: Satisfactory opacification of the pulmonary arteries to the segmental level. No evidence of pulmonary embolism. Normal heart size. No pericardial effusion. No evidence of thoracic aortic aneurysm or dissection. Mediastinum/Nodes: No enlarged mediastinal, hilar, or axillary lymph nodes. Interval increase in size of a heterogeneously enhancing nodule in the left thyroid lobe, currently measuring 3.3 cm (series 302, image 14), previously 2.7 cm. Trachea and esophagus demonstrate no significant findings. Lungs/Pleura: Linear atelectasis and/or scarring in the lingula. No focal consolidation, pleural effusion, or pneumothorax. Upper Abdomen: No acute abnormality. Musculoskeletal: No chest wall abnormality. No acute or significant osseous findings. Review of the MIP images confirms the above findings. IMPRESSION: 1. No evidence of pulmonary embolism or other acute intrathoracic process. 2. Interval increase in size of a heterogeneously enhancing nodule in the left thyroid lobe, currently measuring 3.3 cm, previously 2.7 cm. Recommend thyroid ultrasound. Electronically Signed   By:  Obie Dredge M.D.   On: 09/05/2023 14:28    Procedures Procedures    Medications Ordered in ED Medications  iohexol (OMNIPAQUE) 350 MG/ML injection 100 mL (100 mLs Intravenous Contrast Given 09/05/23 1156)    ED Course/ Medical Decision Making/ A&P                                 Medical Decision Making Amount and/or Complexity of Data Reviewed Labs: ordered. Radiology: ordered.  Risk Prescription drug management.     Differential diagnosis includes but is not limited to ACS, arrhythmia, aortic aneurysm, pericarditis, myocarditis, pericardial effusion, cardiac tamponade, musculoskeletal pain, GERD, Boerhaave's syndrome, DVT/PE, pneumonia, pleural effusion   ED Course:  Patient well-appearing, no acute distress.  Stable vital signs.  Given history of PE and her recent surgery, she is high risk for another pulmonary embolism.  Will obtain CT angio chest. I Ordered, and personally interpreted labs.  The pertinent results include:   CBC and BMP unremarkable Pregnancy negative Initial troponin under 2 Upon re-evaluation, patient remains without complaints, stable vitals. Low concern for ACS at this time given EKG with normal sinus rhythm and no ST changes, troponin remains stable with initial troponin of less than 2 and pain ongoing for a couple days.  CT angiography of the chest did not show any signs of a pulmonary embolism.  No signs of pneumonia, pneumothorax, or pleural effusion.  A thyroid nodule was noted that had increased in size from the last scan.  Follow-up ultrasound was recommended.  This finding was discussed with the patient and she understands she needs to make her PCP aware of this finding as soon as possible and have them schedule a follow-up ultrasound to further evaluate the thyroid nodule.   Patient stable and appropriate for discharge home at this time  Impression: Atypical chest pain  Disposition:  The patient was discharged home with instructions to  make her PCP aware of the CT scan results regarding the thyroid nodule, and have her PCP schedule a thyroid ultrasound for her. Return precautions given.  Imaging Studies ordered: I ordered imaging studies including CT angio chest I independently visualized the imaging with scope of interpretation limited to determining acute life threatening conditions related to emergency care. Imaging showed  No pulmonary embolism noted Thyroid nodule measured at 3.3 cm, previously 2.7 cm.  Thyroid ultrasound follow-up was recommended I agree with the radiologist interpretation   Cardiac Monitoring: / EKG: The patient was maintained on a cardiac monitor.  I personally viewed and interpreted the cardiac monitored which showed an underlying rhythm of: Normal sinus rhythm, no ST changes              Final Clinical Impression(s) / ED Diagnoses Final diagnoses:  Atypical chest pain    Rx / DC Orders ED Discharge Orders     None         Arabella Merles, PA-C 09/05/23 1442    Ernie Avena, MD 09/05/23 1510

## 2023-09-05 NOTE — ED Triage Notes (Signed)
 Pt reports that she has had some chest pain x 3 days and headache. Denies fever or SOB.

## 2023-09-05 NOTE — Discharge Instructions (Signed)
 Your workup today is reassuring. It is very unlikely that your pain is due to an issue in your heart.  Your cardiac enzyme (troponin) was normal today. Your EKG which is a measure of the heart's electrical activity and rhythm is normal today. These would both show abnormalities if you were having a heart attack.  Your CT scan does not show any abnormalities in the lungs, no signs of a blood clot in your lungs.  Your CT showed that you have a thyroid nodule.  A follow-up ultrasound is recommended. Please reach out to your PCP as soon as possible to make your PCP aware of this finding and to have them schedule this ultrasound for you. There is the potential this thyroid nodule could be malignant (cancerous) so it is important that you have this checked.  Return to the ER if you have any shortness of breath, difficulty breathing, worsening chest pain, dizziness, jaw pain, left arm or shoulder pain, abdominal pain, unexplained fever, any other new or concerning symptoms.   "EXAM: CT ANGIOGRAPHY CHEST WITH CONTRAST   TECHNIQUE: Multidetector CT imaging of the chest was performed using the standard protocol during bolus administration of intravenous contrast. Multiplanar CT image reconstructions and MIPs were obtained to evaluate the vascular anatomy.   RADIATION DOSE REDUCTION: This exam was performed according to the departmental dose-optimization program which includes automated exposure control, adjustment of the mA and/or kV according to patient size and/or use of iterative reconstruction technique.   CONTRAST:  OMNIPAQUE IOHEXOL 350 MG/ML SOLN   COMPARISON:  CT chest dated July 03, 2022.   FINDINGS: Cardiovascular: Satisfactory opacification of the pulmonary arteries to the segmental level. No evidence of pulmonary embolism. Normal heart size. No pericardial effusion. No evidence of thoracic aortic aneurysm or dissection.   Mediastinum/Nodes: No enlarged mediastinal, hilar,  or axillary lymph nodes. Interval increase in size of a heterogeneously enhancing nodule in the left thyroid lobe, currently measuring 3.3 cm (series 302, image 14), previously 2.7 cm. Trachea and esophagus demonstrate no significant findings.   Lungs/Pleura: Linear atelectasis and/or scarring in the lingula. No focal consolidation, pleural effusion, or pneumothorax.   Upper Abdomen: No acute abnormality.   Musculoskeletal: No chest wall abnormality. No acute or significant osseous findings.   Review of the MIP images confirms the above findings.   IMPRESSION: 1. No evidence of pulmonary embolism or other acute intrathoracic process. 2. Interval increase in size of a heterogeneously enhancing nodule in the left thyroid lobe, currently measuring 3.3 cm, previously 2.7 cm. Recommend thyroid ultrasound.     Electronically Signed   By: Obie Dredge M.D.   On: 09/05/2023 14:28"

## 2023-09-17 ENCOUNTER — Encounter (HOSPITAL_BASED_OUTPATIENT_CLINIC_OR_DEPARTMENT_OTHER): Payer: Self-pay

## 2023-09-17 ENCOUNTER — Emergency Department (HOSPITAL_BASED_OUTPATIENT_CLINIC_OR_DEPARTMENT_OTHER)
Admission: EM | Admit: 2023-09-17 | Discharge: 2023-09-17 | Disposition: A | Discharge status: Home / Self Care | Attending: Emergency Medicine | Admitting: Emergency Medicine

## 2023-09-17 ENCOUNTER — Emergency Department (HOSPITAL_BASED_OUTPATIENT_CLINIC_OR_DEPARTMENT_OTHER)

## 2023-09-17 DIAGNOSIS — M25531 Pain in right wrist: Secondary | ICD-10-CM | POA: Insufficient documentation

## 2023-09-17 DIAGNOSIS — Z7901 Long term (current) use of anticoagulants: Secondary | ICD-10-CM | POA: Insufficient documentation

## 2023-09-17 NOTE — ED Notes (Signed)
 Hands are swollen, hx of carpal tunnel without improvement. Did not go to PCP, came to ER twice.

## 2023-09-17 NOTE — Discharge Instructions (Addendum)
 Your exam today was reassuring.  X-ray without any acute concern.  I spoke to your hand surgery office.  They would like to see you tomorrow morning at 8 AM.  Please go to their clinic.  Return for any emergent symptoms.  You can take Tylenol 1000 mg every 6 hours, you can take ibuprofen 600 mg every 6 hours.

## 2023-09-17 NOTE — ED Triage Notes (Signed)
 Pt reports carpel tunnel sx 07/27/23 without problems until Friday. Pt reports at [facility] and sleeping in chair with hands hanging down and since pain and swelling palm of right hand. Pain shooting up arm. Increase warmth in hand

## 2023-09-17 NOTE — ED Provider Notes (Signed)
 [facility] Provider Note   CSN: [id number] Arrival date & time: 09/17/23  4098     History  Chief Complaint  Patient presents with   Hand Pain    Andrea Preston is a 33 y.o. female.  33 year old female presents today for concern of worsening pain, swelling, redness to her right breast.  She had carpal tunnel released about 2 months ago.  She states the symptoms started on Friday.  She was seen at the hand surgery clinic on Friday but was told that it does not look infected and she should start occupational therapy.  She states that her symptoms somewhat worsened since then and she wants to ensure that there is no infection.  The history is provided by the patient. No language interpreter was used.       Home Medications Prior to Admission medications   Medication Sig Start Date End Date Taking? Authorizing Provider  acetaminophen (TYLENOL) 500 MG tablet Take 1,000 mg by mouth every 6 (six) hours as needed for mild pain or headache.    [provider]  albuterol (VENTOLIN HFA) 108 (90 Base) MCG/ACT inhaler Inhale 1 puff into the lungs every 4 (four) hours as needed for wheezing or shortness of breath. 03/19/22   Arrien, York Ram, MD  apixaban (ELIQUIS) 5 MG TABS tablet Take 2 tablets (10 mg total) by mouth 2 (two) times daily for 2 days, THEN 1 tablet (5 mg total) 2 (two) times daily for 28 days. 03/19/22 04/18/22  Arrien, York Ram, MD  cyclobenzaprine (FLEXERIL) 5 MG tablet Take 1 tablet (5 mg total) by mouth 3 (three) times daily as needed for muscle spasms (neck pain.). 03/19/22   Arrien, York Ram, MD  ferrous sulfate 325 (65 FE) MG tablet Take 1 tablet (325 mg total) by mouth daily with breakfast. 03/20/22 04/19/22  Arrien, York Ram, MD  polyethylene glycol (MIRALAX / GLYCOLAX) 17 g packet Take 17 g by mouth daily as needed for mild constipation. 03/19/22   Arrien, York Ram, MD      Allergies     Patient has no known allergies.    Review of Systems   Review of Systems  Constitutional:  Negative for chills and fever.  Musculoskeletal:  Positive for arthralgias and joint swelling.  All other systems reviewed and are negative.   Physical Exam Updated Vital Signs BP 134/86   Pulse 86   Temp 98.4 F (36.9 C) (Oral)   Resp 18   Wt 98.4 kg   SpO2 99%   BMI 39.69 kg/m  Physical Exam Vitals and nursing note reviewed.  Constitutional:      General: She is not in acute distress.    Appearance: Normal appearance. She is not ill-appearing.  HENT:     Head: Normocephalic and atraumatic.     Nose: Nose normal.  Eyes:     Conjunctiva/sclera: Conjunctivae normal.  Pulmonary:     Effort: Pulmonary effort is normal. No respiratory distress.  Musculoskeletal:        General: No deformity. Normal range of motion.     Comments: Right upper extremity is neurovascularly intact.  Well-healed incision noted to volar aspect of the right wrist.  Minimal erythema surrounding the incision site with mild swelling.  Compartments are soft.  Skin:    Findings: No rash.  Neurological:     Mental Status: She is alert.     ED Results / Procedures / Treatments   Labs (all labs  ordered are listed, but only abnormal results are displayed) Labs Reviewed - No data to display  EKG None  Radiology DG Hand Complete Right Result Date: 09/17/2023 CLINICAL DATA:  Pain and swelling and pain. EXAM: RIGHT HAND - COMPLETE 3+ VIEW COMPARISON:  None Available. FINDINGS: There is no evidence of fracture or dislocation. There is no evidence of arthropathy or other focal bone abnormality. Soft tissues are unremarkable. IMPRESSION: Negative. Electronically Signed   By: Kennith Center M.D.   On: 09/17/2023 10:31    Procedures Procedures    Medications Ordered in ED Medications - No data to display  ED Course/ Medical Decision Making/ A&P                                 Medical Decision Making Amount  and/or Complexity of Data Reviewed Radiology: ordered.   33 year old female presents today for concern of worsening pain, swelling, redness to her right wrist.  Patient is right-hand dominant she had carpal tunnel release about 2 months ago.  Symptoms started on Friday and have worsened somewhat since then.  Attached image above for description.  X-ray obtained here which is negative for acute concern.  I personally reviewed and agree with radiology interpretation. I discussed the case with patient's primary hand surgery office.  Spoke with the PA who spoke with Dr. Doroteo Glassman.  Dr. Doroteo Glassman recommend patient comes to his office tomorrow morning at 8 AM.  They will schedule the appointment for the patient.  Patient is agreeable to go to the office then.  No initiation of steroids or any other antibiotics at this time.  Patient is in agreement.  Discussed Tylenol and ibuprofen until this appointment. Patient discharged in stable condition.  Return precaution discussed.  Patient voices understanding and is in agreement with plan.   Final Clinical Impression(s) / ED Diagnoses Final diagnoses:  Right wrist pain    Rx / DC Orders ED Discharge Orders     None         Marita Kansas, PA-C 09/17/23 1310    Rolan Bucco, MD 09/17/23 1314

## 2023-12-06 ENCOUNTER — Other Ambulatory Visit: Payer: Self-pay

## 2023-12-06 ENCOUNTER — Encounter (HOSPITAL_BASED_OUTPATIENT_CLINIC_OR_DEPARTMENT_OTHER): Payer: Self-pay | Admitting: Emergency Medicine

## 2023-12-06 ENCOUNTER — Emergency Department (HOSPITAL_BASED_OUTPATIENT_CLINIC_OR_DEPARTMENT_OTHER)

## 2023-12-06 ENCOUNTER — Emergency Department (HOSPITAL_BASED_OUTPATIENT_CLINIC_OR_DEPARTMENT_OTHER)
Admission: EM | Admit: 2023-12-06 | Discharge: 2023-12-06 | Disposition: A | Discharge status: Home / Self Care | Attending: Emergency Medicine | Admitting: Emergency Medicine

## 2023-12-06 DIAGNOSIS — M542 Cervicalgia: Secondary | ICD-10-CM | POA: Diagnosis present

## 2023-12-06 DIAGNOSIS — Z7901 Long term (current) use of anticoagulants: Secondary | ICD-10-CM | POA: Diagnosis not present

## 2023-12-06 DIAGNOSIS — M25512 Pain in left shoulder: Secondary | ICD-10-CM | POA: Insufficient documentation

## 2023-12-06 DIAGNOSIS — R079 Chest pain, unspecified: Secondary | ICD-10-CM | POA: Diagnosis not present

## 2023-12-06 LAB — CBC WITH DIFFERENTIAL/PLATELET
Abs Immature Granulocytes: 0.01 10*3/uL (ref 0.00–0.07)
Basophils Absolute: 0 10*3/uL (ref 0.0–0.1)
Basophils Relative: 1 %
Eosinophils Absolute: 0.2 10*3/uL (ref 0.0–0.5)
Eosinophils Relative: 3 %
HCT: 40.9 % (ref 36.0–46.0)
Hemoglobin: 14 g/dL (ref 12.0–15.0)
Immature Granulocytes: 0 %
Lymphocytes Relative: 45 %
Lymphs Abs: 2.5 10*3/uL (ref 0.7–4.0)
MCH: 27.6 pg (ref 26.0–34.0)
MCHC: 34.2 g/dL (ref 30.0–36.0)
MCV: 80.5 fL (ref 80.0–100.0)
Monocytes Absolute: 0.5 10*3/uL (ref 0.1–1.0)
Monocytes Relative: 9 %
Neutro Abs: 2.3 10*3/uL (ref 1.7–7.7)
Neutrophils Relative %: 42 %
Platelets: 287 10*3/uL (ref 150–400)
RBC: 5.08 MIL/uL (ref 3.87–5.11)
RDW: 13.9 % (ref 11.5–15.5)
WBC: 5.5 10*3/uL (ref 4.0–10.5)
nRBC: 0 % (ref 0.0–0.2)

## 2023-12-06 LAB — BASIC METABOLIC PANEL WITH GFR
Anion gap: 12 (ref 5–15)
BUN: 7 mg/dL (ref 6–20)
CO2: 23 mmol/L (ref 22–32)
Calcium: 9 mg/dL (ref 8.9–10.3)
Chloride: 104 mmol/L (ref 98–111)
Creatinine, Ser: 0.77 mg/dL (ref 0.44–1.00)
GFR, Estimated: >60 mL/min (ref >60)
Glucose, Bld: 111 mg/dL — ABNORMAL HIGH (ref 70–99)
Potassium: 3.6 mmol/L (ref 3.5–5.1)
Sodium: 138 mmol/L (ref 135–145)

## 2023-12-06 LAB — TROPONIN T, HIGH SENSITIVITY
Troponin T High Sensitivity: <15 ng/L (ref <19)
Troponin T High Sensitivity: <15 ng/L (ref <19)

## 2023-12-06 LAB — PREGNANCY, URINE: Preg Test, Ur: NEGATIVE

## 2023-12-06 MED ORDER — KETOROLAC TROMETHAMINE 15 MG/ML IJ SOLN
15.0000 mg | Freq: Once | INTRAMUSCULAR | Status: AC
  Administered 2023-12-06: 15 mg via INTRAMUSCULAR
  Filled 2023-12-06: qty 1

## 2023-12-06 MED ORDER — METHOCARBAMOL 500 MG PO TABS
500.0000 mg | ORAL_TABLET | Freq: Once | ORAL | Status: AC
  Administered 2023-12-06: 500 mg via ORAL
  Filled 2023-12-06: qty 1

## 2023-12-06 MED ORDER — METHOCARBAMOL 500 MG PO TABS: 500.0000 mg | ORAL_TABLET | Freq: Two times a day (BID) | ORAL | 0 refills | Status: AC

## 2023-12-06 MED ORDER — NAPROXEN 500 MG PO TABS
500.0000 mg | ORAL_TABLET | Freq: Two times a day (BID) | ORAL | 0 refills | Status: AC
Start: 2023-12-06 — End: ?

## 2023-12-06 NOTE — ED Triage Notes (Signed)
 Pt c/o mid to LT CP, HA  and had dizziness earlier after a procedure to aspirate a thyroid cyst this morning; describes CP as aching

## 2023-12-06 NOTE — Discharge Instructions (Signed)
 I would recommend taking naproxen  twice daily as well as Tylenol .  You can use ice and heat over area of pain.  Use Robaxin  as needed for muscle spasm but do not take when driving or operating heavy machinery.  Return to emergency room with new or worsening symptoms.  Follow-up with primary care

## 2023-12-06 NOTE — ED Provider Notes (Signed)
 [place] [facility] Provider Note   CSN: [id number] Arrival date & time: 12/06/23  1712     Patient presents with: Chest Pain   Andrea Preston is a 33 y.o. female.  Patient presents to emergency room with complaint of upper left-sided chest pain which she locates to the base of her left side of her neck and left shoulder.  It is worse when moving her shoulder and turning her head side-to-side.  She describes this as dull ache.  She denies any injury trauma or fall.  This has been ongoing for a week. She reports it feels like a sore muscle.  Also notes that she is status post thyroid aspiration that happened today.  She would like this area checked.  She has some mild pain because she thinks the lidocaine has worn off but no other complication.  Denies any fever, cough or shortness of breath.  No swelling in lower extremities.    Chest Pain      Prior to Admission medications   Medication Sig Start Date End Date Taking? Authorizing Provider  acetaminophen  (TYLENOL ) 500 MG tablet Take 1,000 mg by mouth every 6 (six) hours as needed for mild pain or headache.    [provider]  albuterol  (VENTOLIN  HFA) 108 (90 Base) MCG/ACT inhaler Inhale 1 puff into the lungs every 4 (four) hours as needed for wheezing or shortness of breath. 03/19/22   Arrien, Elidia Sieving, MD  apixaban  (ELIQUIS ) 5 MG TABS tablet Take 2 tablets (10 mg total) by mouth 2 (two) times daily for 2 days, THEN 1 tablet (5 mg total) 2 (two) times daily for 28 days. 03/19/22 04/18/22  Arrien, Elidia Sieving, MD  cyclobenzaprine  (FLEXERIL ) 5 MG tablet Take 1 tablet (5 mg total) by mouth 3 (three) times daily as needed for muscle spasms (neck pain.). 03/19/22   Arrien, Elidia Sieving, MD  ferrous sulfate  325 (65 FE) MG tablet Take 1 tablet (325 mg total) by mouth daily with breakfast. 03/20/22 04/19/22  Arrien, Mauricio Daniel, MD  polyethylene glycol (MIRALAX  / GLYCOLAX ) 17 g packet Take  17 g by mouth daily as needed for mild constipation. 03/19/22   Arrien, Elidia Sieving, MD    Allergies: Patient has no known allergies.    Review of Systems  Cardiovascular:  Positive for chest pain.    Updated Vital Signs BP 123/82 (BP Location: Right Arm)   Pulse 97   Temp 98.2 F (36.8 C)   Resp 18   Ht 5' 2 (1.575 m)   Wt 99.8 kg   SpO2 99%   BMI 40.24 kg/m   Physical Exam Vitals and nursing note reviewed.  Constitutional:      General: She is not in acute distress.    Appearance: She is not toxic-appearing.  HENT:     Head: Normocephalic and atraumatic.   Eyes:     General: No scleral icterus.    Conjunctiva/sclera: Conjunctivae normal.   Neck:     Comments: Band-Aid over center of neck with small puncture wound consistent with reported thyroid needle aspiration.  No ecchymosis or significant hematoma. Cardiovascular:     Rate and Rhythm: Normal rate and regular rhythm.     Pulses: Normal pulses.     Heart sounds: Normal heart sounds.  Pulmonary:     Effort: Pulmonary effort is normal. No respiratory distress.     Breath sounds: Normal breath sounds.  Abdominal:     General: Abdomen is flat. Bowel  sounds are normal.     Palpations: Abdomen is soft.     Tenderness: There is no abdominal tenderness.   Musculoskeletal:     Comments: Patient has TTP over sternocleidomastoid muscle.  She has no neck rigidity.  She has normal range of motion of her neck.  No meningeal sign.  Also has no cervical midline tenderness step-off or deformity.  No radicular symptoms.  Strong radial pulse.   Skin:    General: Skin is warm and dry.     Findings: No lesion.   Neurological:     General: No focal deficit present.     Mental Status: She is alert and oriented to person, place, and time. Mental status is at baseline.     (all labs ordered are listed, but only abnormal results are displayed) Labs Reviewed  BASIC METABOLIC PANEL WITH GFR - Abnormal; Notable for the  following components:      Result Value   Glucose, Bld 111 (*)    All other components within normal limits  PREGNANCY, URINE  CBC WITH DIFFERENTIAL/PLATELET  TROPONIN T, HIGH SENSITIVITY  TROPONIN T, HIGH SENSITIVITY    EKG: None  Radiology: DG Chest 2 View Result Date: 12/06/2023 CLINICAL DATA:  Chest pain EXAM: CHEST - 2 VIEW COMPARISON:  Chest x-ray 03/28/2023 FINDINGS: The heart size and mediastinal contours are within normal limits. Both lungs are clear. The visualized skeletal structures are unremarkable. IMPRESSION: No active cardiopulmonary disease. Electronically Signed   By: Greig Pique M.D.   On: 12/06/2023 17:52     Procedures   Medications Ordered in the ED - No data to display  Clinical Course as of 12/06/23 2138  Wed Dec 06, 2023  2136 DG Chest 2 View [JB]    Clinical Course User Index [JB] Michalla Ringer, Warren SAILOR, PA-C                                 Medical Decision Making Amount and/or Complexity of Data Reviewed Labs: ordered. Radiology: ordered.  Risk Prescription drug management.   Andrea Preston 33 y.o. presented today for chest pain. Working DDx that I considered at this time includes, but not limited to, ACS, GERD, pe, pna, aortic dissection, pneumothorax, MSK path, anemia, esophageal rupture, CHF exacerbation, valvular disorder, myocarditis, pericarditis, endocarditis, pericardial effusion/cardiac tamponade, pulmonary edema, gastritis/PUD, esophagitis.  R/o Dx: PE: advanced imaging will not be ordered as alternative diagnoses are more likely at this time Aortic Dissection: less likely based on the history of present illness - location, quality, onset, and severity of symptoms in this case.    Unique Tests and My Interpretation:  EKG: Rate, rhythm, axis, intervals all examined: sinus   Troponin: <15, delta <15 CBC: No leukocytosis no anemia BMP: BMP unremarkable Pregnancy test negative  Chest x-ray shows no acute pathology.   Problem List /  ED Course / Critical interventions / Medication management  She reports to emergency room with complaint of chest pain.  On my exam she primarily localizes her pain to the left part of her neck and into her shoulder.  This has been ongoing intermittently for about a week.  She denies injury trauma or fall.  She has overall reassuring physical exam.  She denies any neurological symptoms like numbness or tingling.  Pain is not severe or sudden onset.  Symptoms seem atypical for ACS but she did have reassuring EKG and 2 negative troponins here.  She  has no sign of pneumonia fracture or pneumothorax on x-ray.  Symptoms do not seem consistent with PE.  Symptoms are not consistent with aortic dissection.  Symptoms seem most consistent with musculoskeletal pain. She has been hemodynamically stable here.  She is well-appearing.  Will trial medication and reassess I ordered medication including Toradol  and Robaxin  for   Reevaluation of the patient after these medicines showed that the patient improved Patients vitals assessed. Upon arrival patient is hemodynamically stable.  I have reviewed the patients home medicines and have made adjustments as needed    Plan: F/u w/ PCP to ensure resolution of sx.  Patient was given return precautions. Patient stable for discharge at this time.  Patient educated on sx/ dx and verbalized understanding of plan.  Will return to ER w/ new or worsening sx.       Final diagnoses:  Neck pain    ED Discharge Orders          Ordered    methocarbamol  (ROBAXIN ) 500 MG tablet  2 times daily        12/06/23 2049    naproxen  (NAPROSYN ) 500 MG tablet  2 times daily        12/06/23 2049               Andrea Preston N, PA-C 12/06/23 2139    Jerrol Agent, MD 12/06/23 [phone]

## 2023-12-06 NOTE — ED Notes (Signed)
 Pt. Had recent Thyroid cyst biopsy and states she woke with pain  and dizziness.  Pt. In no distress.

## 2023-12-06 NOTE — ED Notes (Signed)
 ED Provider at bedside.

## 2024-01-17 ENCOUNTER — Emergency Department (HOSPITAL_BASED_OUTPATIENT_CLINIC_OR_DEPARTMENT_OTHER)

## 2024-01-17 ENCOUNTER — Other Ambulatory Visit: Payer: Self-pay

## 2024-01-17 ENCOUNTER — Emergency Department (HOSPITAL_BASED_OUTPATIENT_CLINIC_OR_DEPARTMENT_OTHER)
Admission: EM | Admit: 2024-01-17 | Discharge: 2024-01-17 | Disposition: A | Discharge status: Home / Self Care | Attending: Emergency Medicine | Admitting: Emergency Medicine

## 2024-01-17 ENCOUNTER — Encounter (HOSPITAL_BASED_OUTPATIENT_CLINIC_OR_DEPARTMENT_OTHER): Payer: Self-pay | Admitting: Emergency Medicine

## 2024-01-17 DIAGNOSIS — G43009 Migraine without aura, not intractable, without status migrainosus: Secondary | ICD-10-CM | POA: Diagnosis not present

## 2024-01-17 DIAGNOSIS — M791 Myalgia, unspecified site: Secondary | ICD-10-CM | POA: Diagnosis not present

## 2024-01-17 DIAGNOSIS — G43909 Migraine, unspecified, not intractable, without status migrainosus: Secondary | ICD-10-CM | POA: Diagnosis present

## 2024-01-17 LAB — COMPREHENSIVE METABOLIC PANEL WITH GFR
ALT: 19 U/L (ref 0–44)
AST: 18 U/L (ref 15–41)
Albumin: 4 g/dL (ref 3.5–5.0)
Alkaline Phosphatase: 88 U/L (ref 38–126)
Anion gap: 10 (ref 5–15)
BUN: 7 mg/dL (ref 6–20)
CO2: 26 mmol/L (ref 22–32)
Calcium: 9.3 mg/dL (ref 8.9–10.3)
Chloride: 103 mmol/L (ref 98–111)
Creatinine, Ser: 0.74 mg/dL (ref 0.44–1.00)
GFR, Estimated: >60 mL/min (ref >60)
Glucose, Bld: 77 mg/dL (ref 70–99)
Potassium: 4 mmol/L (ref 3.5–5.1)
Sodium: 139 mmol/L (ref 135–145)
Total Bilirubin: 0.4 mg/dL (ref 0.0–1.2)
Total Protein: 7.7 g/dL (ref 6.5–8.1)

## 2024-01-17 LAB — CBC WITH DIFFERENTIAL/PLATELET
Abs Immature Granulocytes: 0.01 K/uL (ref 0.00–0.07)
Basophils Absolute: 0 K/uL (ref 0.0–0.1)
Basophils Relative: 1 %
Eosinophils Absolute: 0.2 K/uL (ref 0.0–0.5)
Eosinophils Relative: 3 %
HCT: 41.6 % (ref 36.0–46.0)
Hemoglobin: 14.1 g/dL (ref 12.0–15.0)
Immature Granulocytes: 0 %
Lymphocytes Relative: 48 %
Lymphs Abs: 3.1 K/uL (ref 0.7–4.0)
MCH: 27.6 pg (ref 26.0–34.0)
MCHC: 33.9 g/dL (ref 30.0–36.0)
MCV: 81.6 fL (ref 80.0–100.0)
Monocytes Absolute: 0.4 K/uL (ref 0.1–1.0)
Monocytes Relative: 7 %
Neutro Abs: 2.6 K/uL (ref 1.7–7.7)
Neutrophils Relative %: 41 %
Platelets: 264 K/uL (ref 150–400)
RBC: 5.1 MIL/uL (ref 3.87–5.11)
RDW: 14 % (ref 11.5–15.5)
WBC: 6.4 K/uL (ref 4.0–10.5)
nRBC: 0 % (ref 0.0–0.2)

## 2024-01-17 LAB — RESP PANEL BY RT-PCR (RSV, FLU A&B, COVID)  RVPGX2
Influenza A by PCR: NEGATIVE
Influenza B by PCR: NEGATIVE
Resp Syncytial Virus by PCR: NEGATIVE
SARS Coronavirus 2 by RT PCR: NEGATIVE

## 2024-01-17 LAB — HCG, QUANTITATIVE, PREGNANCY: hCG, Beta Chain, Quant, S: <1 m[IU]/mL (ref <5)

## 2024-01-17 MED ORDER — SODIUM CHLORIDE 0.9 % IV BOLUS
1000.0000 mL | Freq: Once | INTRAVENOUS | Status: AC
  Administered 2024-01-17: 1000 mL via INTRAVENOUS

## 2024-01-17 MED ORDER — KETOROLAC TROMETHAMINE 15 MG/ML IJ SOLN
15.0000 mg | Freq: Once | INTRAMUSCULAR | Status: AC
  Administered 2024-01-17: 15 mg via INTRAVENOUS
  Filled 2024-01-17: qty 1

## 2024-01-17 MED ORDER — DIPHENHYDRAMINE HCL 50 MG/ML IJ SOLN
12.5000 mg | Freq: Once | INTRAMUSCULAR | Status: AC
  Administered 2024-01-17: 12.5 mg via INTRAVENOUS
  Filled 2024-01-17: qty 1

## 2024-01-17 MED ORDER — PROCHLORPERAZINE EDISYLATE 10 MG/2ML IJ SOLN
5.0000 mg | Freq: Once | INTRAMUSCULAR | Status: AC
  Administered 2024-01-17: 5 mg via INTRAVENOUS
  Filled 2024-01-17: qty 2

## 2024-01-17 MED ORDER — DEXAMETHASONE SODIUM PHOSPHATE 10 MG/ML IJ SOLN
10.0000 mg | Freq: Once | INTRAMUSCULAR | Status: AC
  Administered 2024-01-17: 10 mg via INTRAVENOUS
  Filled 2024-01-17: qty 1

## 2024-01-17 NOTE — ED Notes (Signed)
 Patient transported to CT

## 2024-01-17 NOTE — ED Triage Notes (Signed)
 Pt reports migraine, hx of migraines, c/o lightheadedness, weakness, and body aches  Denies ShoB, fever, cough, congestion or other s/s

## 2024-01-17 NOTE — ED Notes (Signed)
 Pt c/o migraine ( has hx ) used to be on meds and is not now.  Also c/o body aches Denies n/v

## 2024-01-17 NOTE — ED Provider Notes (Signed)
 [place] [facility] Provider Note   CSN: [id number] Arrival date & time: 01/17/24  2000     Patient presents with: Migraine   Andrea Preston is a 33 y.o. female.   Patient here with migraines history of migraines.  She feels generally weak with some bodyaches and viral symptoms today.  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  Denies any chest pain shortness of breath.  No vision changes no stroke symptoms.  Denies any nausea vomiting diarrhea.  The history is provided by the patient.       Prior to Admission medications   Medication Sig Start Date End Date Taking? Authorizing Provider  acetaminophen  (TYLENOL ) 500 MG tablet Take 1,000 mg by mouth every 6 (six) hours as needed for mild pain or headache.    [provider]  albuterol  (VENTOLIN  HFA) 108 (90 Base) MCG/ACT inhaler Inhale 1 puff into the lungs every 4 (four) hours as needed for wheezing or shortness of breath. 03/19/22   Arrien, Elidia Sieving, MD  apixaban  (ELIQUIS ) 5 MG TABS tablet Take 2 tablets (10 mg total) by mouth 2 (two) times daily for 2 days, THEN 1 tablet (5 mg total) 2 (two) times daily for 28 days. 03/19/22 04/18/22  Arrien, Elidia Sieving, MD  cyclobenzaprine  (FLEXERIL ) 5 MG tablet Take 1 tablet (5 mg total) by mouth 3 (three) times daily as needed for muscle spasms (neck pain.). 03/19/22   Arrien, Elidia Sieving, MD  ferrous sulfate  325 (65 FE) MG tablet Take 1 tablet (325 mg total) by mouth daily with breakfast. 03/20/22 04/19/22  Arrien, Elidia Sieving, MD  methocarbamol  (ROBAXIN ) 500 MG tablet Take 1 tablet (500 mg total) by mouth 2 (two) times daily. 12/06/23   Barrett, Warren SAILOR, PA-C  naproxen  (NAPROSYN ) 500 MG tablet Take 1 tablet (500 mg total) by mouth 2 (two) times daily. 12/06/23   Barrett, Jamie N, PA-C  polyethylene glycol (MIRALAX  / GLYCOLAX ) 17 g packet Take 17 g by mouth daily as needed for mild constipation. 03/19/22   Arrien, Elidia Sieving, MD     Allergies: Patient has no known allergies.    Review of Systems  Updated Vital Signs BP (!) 143/95   Pulse 84   Temp 97.7 F (36.5 C)   Resp 16   Ht 5' 2 (1.575 m)   Wt 99.8 kg   SpO2 99%   BMI 40.24 kg/m   Physical Exam Vitals and nursing note reviewed.  Constitutional:      General: She is not in acute distress.    Appearance: She is well-developed. She is not ill-appearing.  HENT:     Head: Normocephalic and atraumatic.     Nose: Nose normal.     Mouth/Throat:     Mouth: Mucous membranes are moist.  Eyes:     Extraocular Movements: Extraocular movements intact.     Conjunctiva/sclera: Conjunctivae normal.     Pupils: Pupils are equal, round, and reactive to light.  Cardiovascular:     Rate and Rhythm: Normal rate and regular rhythm.     Pulses: Normal pulses.     Heart sounds: Normal heart sounds. No murmur heard. Pulmonary:     Effort: Pulmonary effort is normal. No respiratory distress.     Breath sounds: Normal breath sounds.  Abdominal:     General: Abdomen is flat.     Palpations: Abdomen is soft.     Tenderness: There is no abdominal tenderness.  Musculoskeletal:  General: No swelling.     Cervical back: Normal range of motion and neck supple.  Skin:    General: Skin is warm and dry.     Capillary Refill: Capillary refill takes less than 2 seconds.  Neurological:     General: No focal deficit present.     Mental Status: She is alert and oriented to person, place, and time.     Cranial Nerves: No cranial nerve deficit.     Sensory: No sensory deficit.     Motor: No weakness.     Coordination: Coordination normal.     Comments: 5+ out of 5 strength throughout, normal sensation, no drift, normal speech, normal finger-nose-finger  Psychiatric:        Mood and Affect: Mood normal.     (all labs ordered are listed, but only abnormal results are displayed) Labs Reviewed  RESP PANEL BY RT-PCR (RSV, FLU A&B, COVID)  [id number]  CBC WITH  DIFFERENTIAL/PLATELET  COMPREHENSIVE METABOLIC PANEL WITH GFR  HCG, QUANTITATIVE, PREGNANCY    EKG: None  Radiology: CT Head Wo Contrast Result Date: 01/17/2024 CLINICAL DATA:  Headache, increasing frequency or severity. EXAM: CT HEAD WITHOUT CONTRAST TECHNIQUE: Contiguous axial images were obtained from the base of the skull through the vertex without intravenous contrast. RADIATION DOSE REDUCTION: This exam was performed according to the departmental dose-optimization program which includes automated exposure control, adjustment of the mA and/or kV according to patient size and/or use of iterative reconstruction technique. COMPARISON:  Head CT of 01/29/2016. FINDINGS: Brain: No evidence of acute infarction, hemorrhage, hydrocephalus, extra-axial collection or mass lesion/mass effect. 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 Vascular: No hyperdense vessel or unexpected calcification. Skull: Negative for fractures or focal lesions. Sinuses/Orbits: Visualized sinuses and mastoid air cells are clear. Visualized orbits are unremarkable. The nasal septum deviates to the right. 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 IMPRESSION: 1. No acute intracranial CT findings or interval changes. 2. 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 3. Right-sided nasal septal deviation. Electronically Signed   By: Francis Quam M.D.   On: 01/17/2024 21:27     Procedures   Medications Ordered in the ED  sodium chloride  0.9 % bolus 1,000 mL (1,000 mLs Intravenous New Bag/Given 01/17/24 2133)  prochlorperazine  (COMPAZINE ) injection 5 mg (5 mg Intravenous Given 01/17/24 2127)  diphenhydrAMINE  (BENADRYL ) injection 12.5 mg (12.5 mg Intravenous Given 01/17/24 2127)  ketorolac  (TORADOL ) 15 MG/ML injection 15 mg (15 mg Intravenous Given 01/17/24 2149)  dexamethasone  (DECADRON ) injection 10 mg  (10 mg Intravenous Given 01/17/24 2148)                                    Medical Decision Making Amount and/or Complexity of Data Reviewed Labs: ordered. Radiology: ordered.  Risk Prescription drug management.   Andrea Preston is here with migraines.  History of migraine.  History of PE not on anticoagulation.  Overall she is having headaches body aches.  Differential diagnosis likely migraine versus viral process.  Have no concern for stroke ACS PE or other acute process otherwise.  Will get CT head basic labs give headache cocktail Compazine  Benadryl  fluid bolus.  Have no concern for meningitis or other acute process at this time.  Patient is well-appearing.  She denies any  trauma.  She has no chest pain or shortness of breath.  No meningeal signs.  Overall lab work per my review and interpretation shows no significant leukocytosis anemia or electrolyte abnormality.  COVID flu RSV test negative.  Head CT unremarkable per radiology report.  Overall patient feeling better.  I do suspect may be migraine type process or viral type process.  Continue supportive care at home.  Discharge.  Understand return precautions.  This chart was dictated using voice recognition software.  Despite best efforts to proofread,  errors can occur which can change the documentation meaning.      Final diagnoses:  Migraine without aura and without status migrainosus, not intractable    ED Discharge Orders     None          Ruthe Cornet, DO 01/17/24 2206

## 2024-02-28 ENCOUNTER — Other Ambulatory Visit: Payer: Self-pay

## 2024-02-28 ENCOUNTER — Encounter (HOSPITAL_BASED_OUTPATIENT_CLINIC_OR_DEPARTMENT_OTHER): Payer: Self-pay

## 2024-02-28 ENCOUNTER — Emergency Department (HOSPITAL_BASED_OUTPATIENT_CLINIC_OR_DEPARTMENT_OTHER)

## 2024-02-28 ENCOUNTER — Emergency Department (HOSPITAL_BASED_OUTPATIENT_CLINIC_OR_DEPARTMENT_OTHER)
Admission: EM | Admit: 2024-02-28 | Discharge: 2024-02-28 | Disposition: A | Discharge status: Home / Self Care | Attending: Emergency Medicine | Admitting: Emergency Medicine

## 2024-02-28 DIAGNOSIS — Z7901 Long term (current) use of anticoagulants: Secondary | ICD-10-CM | POA: Insufficient documentation

## 2024-02-28 DIAGNOSIS — M25531 Pain in right wrist: Secondary | ICD-10-CM | POA: Diagnosis present

## 2024-02-28 DIAGNOSIS — G5601 Carpal tunnel syndrome, right upper limb: Secondary | ICD-10-CM | POA: Insufficient documentation

## 2024-02-28 LAB — PREGNANCY, URINE: Preg Test, Ur: NEGATIVE

## 2024-02-28 MED ORDER — ACETAMINOPHEN 500 MG PO TABS
1000.0000 mg | ORAL_TABLET | Freq: Once | ORAL | Status: AC
  Administered 2024-02-28: 1000 mg via ORAL
  Filled 2024-02-28: qty 2

## 2024-02-28 NOTE — ED Provider Notes (Signed)
 [place] [facility] AT [facility] Provider Note   CSN: [id number] Arrival date & time: 02/28/24  0747     History Chief Complaint  Patient presents with   Wrist Pain    HPI: Andrea Preston is a 33 y.o. female with history pertinent carpal tunnel syndrome who presents complaining of persistent symptoms in that hand. Patient arrived via POV.  History provided by patient.  No interpreter required during this encounter.  Patient reports that she has a history of carpal tunnel on her right wrist.  Reports that she had surgery in January 2025 to correct this, however feels that symptoms have been worsened over the past week.  Reports that she went to her PCP and was diagnosed with carpal tunnel syndrome, however symptoms have persisted.  Reports that she has a follow-up 9/19, however she came to the emergency department to see if there were any interventions we could provide in the interim.  Patient's recorded medical, surgical, social, medication list and allergies were reviewed in the Snapshot window as part of the initial history.   Prior to Admission medications   Medication Sig Start Date End Date Taking? Authorizing Provider  acetaminophen  (TYLENOL ) 500 MG tablet Take 1,000 mg by mouth every 6 (six) hours as needed for mild pain or headache.    [provider]  albuterol  (VENTOLIN  HFA) 108 (90 Base) MCG/ACT inhaler Inhale 1 puff into the lungs every 4 (four) hours as needed for wheezing or shortness of breath. 03/19/22   Arrien, Elidia Sieving, MD  apixaban  (ELIQUIS ) 5 MG TABS tablet Take 2 tablets (10 mg total) by mouth 2 (two) times daily for 2 days, THEN 1 tablet (5 mg total) 2 (two) times daily for 28 days. 03/19/22 04/18/22  Arrien, Elidia Sieving, MD  cyclobenzaprine  (FLEXERIL ) 5 MG tablet Take 1 tablet (5 mg total) by mouth 3 (three) times daily as needed for muscle spasms (neck pain.). 03/19/22   Arrien, Elidia Sieving, MD  ferrous sulfate  325 (65 FE)  MG tablet Take 1 tablet (325 mg total) by mouth daily with breakfast. 03/20/22 04/19/22  Arrien, Elidia Sieving, MD  methocarbamol  (ROBAXIN ) 500 MG tablet Take 1 tablet (500 mg total) by mouth 2 (two) times daily. 12/06/23   Barrett, Warren SAILOR, PA-C  naproxen  (NAPROSYN ) 500 MG tablet Take 1 tablet (500 mg total) by mouth 2 (two) times daily. 12/06/23   Barrett, Jamie N, PA-C  polyethylene glycol (MIRALAX  / GLYCOLAX ) 17 g packet Take 17 g by mouth daily as needed for mild constipation. 03/19/22   Arrien, Elidia Sieving, MD     Allergies: Patient has no known allergies.   Review of Systems   ROS as per HPI  Physical Exam Updated Vital Signs BP 133/88   Pulse 91   Temp 98.4 F (36.9 C) (Oral)   Resp 18   SpO2 100%  Physical Exam Vitals and nursing note reviewed.  Constitutional:      General: She is not in acute distress.    Appearance: She is well-developed.  HENT:     Head: Normocephalic and atraumatic.  Eyes:     Conjunctiva/sclera: Conjunctivae normal.  Cardiovascular:     Rate and Rhythm: Normal rate and regular rhythm.     Heart sounds: No murmur heard. Pulmonary:     Effort: Pulmonary effort is normal. No respiratory distress.     Breath sounds: Normal breath sounds.  Abdominal:     Palpations: Abdomen is soft.     Tenderness: There  is no abdominal tenderness.  Musculoskeletal:        General: No swelling.     Cervical back: Neck supple.     Comments: Positive Phalen and Tinel's sign on the right side  Skin:    General: Skin is warm and dry.     Capillary Refill: Capillary refill takes less than 2 seconds.  Neurological:     Mental Status: She is alert.  Psychiatric:        Mood and Affect: Mood normal.     ED Course/ Medical Decision Making/ A&P    Procedures Procedures   Medications Ordered in ED Medications  acetaminophen  (TYLENOL ) tablet 1,000 mg (1,000 mg Oral Given 02/28/24 0923)    Medical Decision Making:   Andrea Preston is a 33 y.o. female who  presents for tingling in the right hand as per above.  Physical exam is pertinent for positive Phalen and Tinel signs on the right hand.   The differential includes but is not limited to carpal tunnel syndrome, fracture, dislocation, pregnancy.  Independent historian: None  External data reviewed: No pertinent external data  Labs: Ordered, Independent interpretation, and Details: Urine pregnancy test negative  Radiology: Ordered, Independent interpretation, Details: No displaced fracture, dislocation, traumatic malalignment, and All images reviewed independently.  Agree with radiology report at this time.   DG Wrist Complete Right Result Date: 02/28/2024 CLINICAL DATA:  33 year old female with right wrist pain with no recent injury. Dysuria prior carpal tunnel surgery. EXAM: RIGHT WRIST - COMPLETE 3+ VIEW COMPARISON:  Right wrist radiograph 11/29/2023. FINDINGS: Four views 0843 hours. Bone mineralization is within normal limits. Maintained joint spaces and alignment. No osseous abnormality identified. No discrete soft tissue abnormality. IMPRESSION: Negative. Electronically Signed   By: VEAR Hurst M.D.   On: 02/28/2024 08:37    EKG/Medicine tests: Not indicated EKG Interpretation:                  Interventions: Tylenol , wrist brace  See the EMR for full details regarding lab and imaging results.  Patient presents with a history of carpal tunnel, states that it has been worsening.  Does have positive Phalen's and Tinel's test consistent with carpal tunnel syndrome.  No trauma, x-ray without fracture or dislocation.  Patient with partial improvement after Tylenol .  Patient does not have wrist brace at home, therefore given wrist splint with improvement in patient's symptoms.  Discussed wearing this for 24 to 48 hours until symptoms improve, and then wearing at night.  Encourage patient to keep hand surgery appointment in 2 days.  Given pregnancy can worsen carpal tunnel syndrome, pregnancy test  obtained and negative, discussed use of Tylenol  and ibuprofen  for pain control at home.  Presentation is most consistent with acute uncomplicated illness, Current presentation is complicated by underlying chronic conditions, and I did consider and rule out acute life/limb-threatening illness  Discussion of management or test interpretations with external provider(s): Not indicated  Risk Drugs:OTC drugs  Disposition: DISCHARGE: I believe that the patient is safe for discharge home with outpatient follow-up. Patient was informed of all pertinent physical exam, laboratory, and imaging findings. Patient's suspected etiology of their symptom presentation was discussed with the patient and all questions were answered. We discussed following up with PCP, hand surgery. I provided thorough ED return precautions. The patient feels safe and comfortable with this plan.  MDM generated using voice dictation software and may contain dictation errors.  Please contact me for any clarification or with any questions.  Clinical  Impression:  1. Carpal tunnel syndrome of right wrist      Discharge   Final Clinical Impression(s) / ED Diagnoses Final diagnoses:  Carpal tunnel syndrome of right wrist    Rx / DC Orders ED Discharge Orders     None        Rogelia Jerilynn RAMAN, MD 02/28/24 1020

## 2024-02-28 NOTE — ED Triage Notes (Signed)
 Reports pain in R wrist. No known injury. States had carpal tunnel surgery 07/2023.

## 2024-02-28 NOTE — Discharge Instructions (Signed)
 Andrea Preston  Thank you for allowing us  to take care of you today.  You came to the Emergency Department today because you had recurrent carpal tunnel syndromes in your right hand.  We gave you a wrist brace and this will keep you in the correct position.  You should wear this for the next 24 to 48 hours, and then wear it while sleeping to help keep your hand in the correct position at night.  You can use Tylenol  and ibuprofen .  We did check a pregnancy test as pregnancy can cause worsening of carpal tunnel syndrome, and this was negative.  We recommend continuing to follow-up with your hand surgeon on Friday as you state that you have a scheduled appointment.[name]  To-Do: 1. Please follow-up with your primary doctor within 2 weeks / as soon as possible.   Please return to the Emergency Department or call 911 if you experience have worsening of your symptoms, or do not get better, chest pain, shortness of breath, severe or significantly worsening pain, high fever, severe confusion, pass out or have any reason to think that you need emergency medical care.   We hope you feel better soon.   Mitzie Later, MD Department of Emergency Medicine [facility]

## 2024-04-10 ENCOUNTER — Other Ambulatory Visit: Payer: Self-pay

## 2024-04-10 ENCOUNTER — Encounter (HOSPITAL_BASED_OUTPATIENT_CLINIC_OR_DEPARTMENT_OTHER): Payer: Self-pay | Admitting: Emergency Medicine

## 2024-04-10 ENCOUNTER — Emergency Department (HOSPITAL_BASED_OUTPATIENT_CLINIC_OR_DEPARTMENT_OTHER)
Admission: EM | Admit: 2024-04-10 | Discharge: 2024-04-10 | Disposition: A | Discharge status: Home / Self Care | Attending: Emergency Medicine | Admitting: Emergency Medicine

## 2024-04-10 ENCOUNTER — Emergency Department (HOSPITAL_BASED_OUTPATIENT_CLINIC_OR_DEPARTMENT_OTHER)

## 2024-04-10 DIAGNOSIS — Z7901 Long term (current) use of anticoagulants: Secondary | ICD-10-CM | POA: Insufficient documentation

## 2024-04-10 DIAGNOSIS — R0602 Shortness of breath: Secondary | ICD-10-CM | POA: Insufficient documentation

## 2024-04-10 DIAGNOSIS — R0789 Other chest pain: Secondary | ICD-10-CM | POA: Insufficient documentation

## 2024-04-10 DIAGNOSIS — J45909 Unspecified asthma, uncomplicated: Secondary | ICD-10-CM | POA: Insufficient documentation

## 2024-04-10 LAB — CBC WITH DIFFERENTIAL/PLATELET
Abs Immature Granulocytes: 0.01 K/uL (ref 0.00–0.07)
Basophils Absolute: 0 K/uL (ref 0.0–0.1)
Basophils Relative: 1 %
Eosinophils Absolute: 0.2 K/uL (ref 0.0–0.5)
Eosinophils Relative: 3 %
HCT: 43 % (ref 36.0–46.0)
Hemoglobin: 15.1 g/dL — ABNORMAL HIGH (ref 12.0–15.0)
Immature Granulocytes: 0 %
Lymphocytes Relative: 41 %
Lymphs Abs: 2.7 K/uL (ref 0.7–4.0)
MCH: 28.6 pg (ref 26.0–34.0)
MCHC: 35.1 g/dL (ref 30.0–36.0)
MCV: 81.4 fL (ref 80.0–100.0)
Monocytes Absolute: 0.6 K/uL (ref 0.1–1.0)
Monocytes Relative: 8 %
Neutro Abs: 3.1 K/uL (ref 1.7–7.7)
Neutrophils Relative %: 47 %
Platelets: 312 K/uL (ref 150–400)
RBC: 5.28 MIL/uL — ABNORMAL HIGH (ref 3.87–5.11)
RDW: 13.8 % (ref 11.5–15.5)
WBC: 6.6 K/uL (ref 4.0–10.5)
nRBC: 0 % (ref 0.0–0.2)

## 2024-04-10 LAB — BASIC METABOLIC PANEL WITH GFR
Anion gap: 11 (ref 5–15)
BUN: 8 mg/dL (ref 6–20)
CO2: 23 mmol/L (ref 22–32)
Calcium: 9.3 mg/dL (ref 8.9–10.3)
Chloride: 104 mmol/L (ref 98–111)
Creatinine, Ser: 0.69 mg/dL (ref 0.44–1.00)
GFR, Estimated: >60 mL/min (ref >60)
Glucose, Bld: 82 mg/dL (ref 70–99)
Potassium: 4 mmol/L (ref 3.5–5.1)
Sodium: 137 mmol/L (ref 135–145)

## 2024-04-10 LAB — HCG, SERUM, QUALITATIVE: Preg, Serum: NEGATIVE

## 2024-04-10 LAB — TROPONIN T, HIGH SENSITIVITY
Troponin T High Sensitivity: <15 ng/L (ref 0–19)
Troponin T High Sensitivity: <15 ng/L (ref 0–19)

## 2024-04-10 LAB — D-DIMER, QUANTITATIVE: D-Dimer, Quant: 1.01 ug{FEU}/mL — ABNORMAL HIGH (ref 0.00–0.50)

## 2024-04-10 MED ORDER — ALUM & MAG HYDROXIDE-SIMETH 200-200-20 MG/5ML PO SUSP
30.0000 mL | Freq: Once | ORAL | Status: AC
  Administered 2024-04-10: 30 mL via ORAL
  Filled 2024-04-10: qty 30

## 2024-04-10 MED ORDER — IOHEXOL 350 MG/ML SOLN
100.0000 mL | Freq: Once | INTRAVENOUS | Status: AC | PRN
  Administered 2024-04-10: 80 mL via INTRAVENOUS

## 2024-04-10 MED ORDER — LIDOCAINE VISCOUS HCL 2 % MT SOLN
15.0000 mL | Freq: Once | OROMUCOSAL | Status: AC
  Administered 2024-04-10: 15 mL via ORAL
  Filled 2024-04-10: qty 15

## 2024-04-10 MED ORDER — NAPROXEN 500 MG PO TABS: 500.0000 mg | ORAL_TABLET | Freq: Two times a day (BID) | ORAL | 0 refills | Status: AC

## 2024-04-10 MED ORDER — KETOROLAC TROMETHAMINE 15 MG/ML IJ SOLN
30.0000 mg | Freq: Once | INTRAMUSCULAR | Status: AC
  Administered 2024-04-10: 30 mg via INTRAVENOUS
  Filled 2024-04-10: qty 2

## 2024-04-10 NOTE — ED Notes (Signed)
 Pt requesting pain medication for 8/10 chest pain. Provider notified.

## 2024-04-10 NOTE — ED Provider Notes (Cosign Needed Addendum)
 [place] [facility] Provider Note   CSN: [id number] Arrival date & time: 04/10/24  9078     Patient presents with: Chest Pain   Arionne Iams is a 33 y.o. female past medical history significant for PE and asthma presents today for chest pain and associated shortness of breath.  Patient states that the symptoms started around 5 AM.  Patient also reports pain when she moves her left arm.  Patient denies nausea, vomiting, fever, chills, cough, congestion, weakness, numbness, vision changes, any other complaints at this time.    Chest Pain Associated symptoms: shortness of breath        Prior to Admission medications   Medication Sig Start Date End Date Taking? Authorizing Provider  fluticasone (FLONASE) 50 MCG/ACT nasal spray Place 2 sprays into the nose daily. 04/01/24 04/01/25 Yes [provider]  acetaminophen  (TYLENOL ) 500 MG tablet Take 1,000 mg by mouth every 6 (six) hours as needed for mild pain or headache.    [provider]  albuterol  (VENTOLIN  HFA) 108 (90 Base) MCG/ACT inhaler Inhale 1 puff into the lungs every 4 (four) hours as needed for wheezing or shortness of breath. 03/19/22   Arrien, Elidia Sieving, MD  apixaban  (ELIQUIS ) 5 MG TABS tablet Take 2 tablets (10 mg total) by mouth 2 (two) times daily for 2 days, THEN 1 tablet (5 mg total) 2 (two) times daily for 28 days. 03/19/22 04/18/22  Arrien, Elidia Sieving, MD  cyclobenzaprine  (FLEXERIL ) 5 MG tablet Take 1 tablet (5 mg total) by mouth 3 (three) times daily as needed for muscle spasms (neck pain.). 03/19/22   Arrien, Elidia Sieving, MD  ferrous sulfate  325 (65 FE) MG tablet Take 1 tablet (325 mg total) by mouth daily with breakfast. 03/20/22 04/19/22  Arrien, Elidia Sieving, MD  methocarbamol  (ROBAXIN ) 500 MG tablet Take 1 tablet (500 mg total) by mouth 2 (two) times daily. 12/06/23   Barrett, Warren SAILOR, PA-C  naproxen  (NAPROSYN ) 500 MG tablet Take 1 tablet (500 mg  total) by mouth 2 (two) times daily. 12/06/23   Barrett, Jamie N, PA-C  polyethylene glycol (MIRALAX  / GLYCOLAX ) 17 g packet Take 17 g by mouth daily as needed for mild constipation. 03/19/22   Arrien, Mauricio Daniel, MD  SUMAtriptan (IMITREX) 50 MG tablet Take 50-100 mg by mouth once. **1-2 times daily as needed**    [provider]    Allergies: Patient has no known allergies.    Review of Systems  Respiratory:  Positive for shortness of breath.   Cardiovascular:  Positive for chest pain.    Updated Vital Signs BP (!) 136/91 (BP Location: Left Arm)   Pulse 91   Temp 98.1 F (36.7 C) (Oral)   Resp (!) 25   Ht 5' 2 (1.575 m)   Wt 104.3 kg   SpO2 99%   BMI 42.07 kg/m   Physical Exam Vitals and nursing note reviewed.  Constitutional:      General: She is not in acute distress.    Appearance: She is well-developed. She is not toxic-appearing.  HENT:     Head: Normocephalic and atraumatic.  Eyes:     Extraocular Movements: Extraocular movements intact.     Conjunctiva/sclera: Conjunctivae normal.  Cardiovascular:     Rate and Rhythm: Normal rate and regular rhythm.     Heart sounds: Normal heart sounds. No murmur heard. Pulmonary:     Effort: Pulmonary effort is normal. No respiratory distress.     Breath  sounds: Normal breath sounds. No decreased breath sounds or wheezing.  Abdominal:     Palpations: Abdomen is soft.     Tenderness: There is no abdominal tenderness.  Musculoskeletal:        General: No swelling.     Cervical back: Neck supple.  Skin:    General: Skin is warm and dry.     Capillary Refill: Capillary refill takes less than 2 seconds.  Neurological:     Mental Status: She is alert.  Psychiatric:        Mood and Affect: Mood normal.     (all labs ordered are listed, but only abnormal results are displayed) Labs Reviewed  CBC WITH DIFFERENTIAL/PLATELET - Abnormal; Notable for the following components:      Result Value   RBC 5.28 (*)     Hemoglobin 15.1 (*)    All other components within normal limits  D-DIMER, QUANTITATIVE - Abnormal; Notable for the following components:   D-Dimer, Quant 1.01 (*)    All other components within normal limits  BASIC METABOLIC PANEL WITH GFR  HCG, SERUM, QUALITATIVE  TROPONIN T, HIGH SENSITIVITY  TROPONIN T, HIGH SENSITIVITY    EKG: EKG Interpretation Date/Time:  Wednesday April 10 2024 09:29:31 EDT Ventricular Rate:  91 PR Interval:  146 QRS Duration:  82 QT Interval:  359 QTC Calculation: 442 R Axis:   30  Text Interpretation: Sinus rhythm Confirmed by Darra Chew [phone]) on 04/10/2024 9:40:18 AM  Radiology: CT Angio Chest PE W and/or Wo Contrast Result Date: 04/10/2024 CLINICAL DATA:  Pleuritic pain left-sided beginning this morning. Positive D-dimer. Evaluate for pulmonary embolism. EXAM: CT ANGIOGRAPHY CHEST WITH CONTRAST TECHNIQUE: Multidetector CT imaging of the chest was performed using the standard protocol during bolus administration of intravenous contrast. Multiplanar CT image reconstructions and MIPs were obtained to evaluate the vascular anatomy. RADIATION DOSE REDUCTION: This exam was performed according to the departmental dose-optimization program which includes automated exposure control, adjustment of the mA and/or kV according to patient size and/or use of iterative reconstruction technique. CONTRAST:  80mL OMNIPAQUE  IOHEXOL  350 MG/ML SOLN COMPARISON:  09/05/2023 FINDINGS: Cardiovascular: Heart is normal size. Thoracic aorta is normal caliber. Pulmonary arterial system is not optimally opacified, although demonstrates no definite evidence of emboli. Remaining vascular structures are unremarkable. Mediastinum/Nodes: No mediastinal or hilar adenopathy. Remaining mediastinal structures are unremarkable. Stable enlarged heterogeneous thyroid gland. Lungs/Pleura: Lungs are adequately inflated without focal airspace consolidation or effusion. Mild linear scarring over the  lingula. Airways are normal. Upper Abdomen: No acute findings in the visualized upper abdomen. Musculoskeletal: No focal abnormality. Review of the MIP images confirms the above findings. IMPRESSION: 1. No acute cardiopulmonary disease and no definite evidence of pulmonary embolism. 2. Stable enlarged heterogeneous thyroid gland. Electronically Signed   By: Toribio Agreste M.D.   On: 04/10/2024 13:20   DG Chest 2 View Result Date: 04/10/2024 EXAM: 2 VIEW(S) XRAY OF THE CHEST 04/10/2024 10:40:38 AM COMPARISON: 12/06/2023 CLINICAL HISTORY: Left-sided chest pain beginning this morning. FINDINGS: LUNGS AND PLEURA: Low lung volume. No focal pulmonary opacity. No pulmonary edema. No pleural effusion. No pneumothorax. HEART AND MEDIASTINUM: No acute abnormality of the cardiac and mediastinal silhouettes. BONES AND SOFT TISSUES: No acute osseous abnormality. IMPRESSION: 1. No acute cardiopulmonary process. Electronically signed by: Norleen Kil MD 04/10/2024 11:56 AM EDT RP Workstation: [id number]     Procedures   Medications Ordered in the ED  alum & mag hydroxide-simeth (MAALOX/MYLANTA) 200-200-20 MG/5ML suspension 30 mL (has no administration in  time range)    And  lidocaine (XYLOCAINE) 2 % viscous mouth solution 15 mL (has no administration in time range)  iohexol  (OMNIPAQUE ) 350 MG/ML injection 100 mL (80 mLs Intravenous Contrast Given 04/10/24 1131)  ketorolac  (TORADOL ) 15 MG/ML injection 30 mg (30 mg Intravenous Given 04/10/24 1315)                                    Medical Decision Making Amount and/or Complexity of Data Reviewed Labs: ordered. Radiology: ordered.  Risk OTC drugs. Prescription drug management.   This patient presents to the ED for concern of chest pain and SHOB, this involves an extensive number of treatment options, and is a complaint that carries with it a high risk of complications and morbidity.  The differential diagnosis includes STEMI, NSTEMI, arrhythmia, PE,  costochondritis, anemia, electrolyte abnormality, pregnancy   Additional history obtained:  Additional history obtained from EMR External records from outside source obtained and reviewed including Care Everywhere   Lab Tests:  I Ordered, and personally interpreted labs.  The pertinent results include: Negative pregnancy, D-dimer 1.01, delta troponin less than 15, BMP unremarkable, mildly elevated hemoglobin at 15.1   Imaging Studies ordered:  I ordered imaging studies including CXR  I independently visualized and interpreted imaging which showed no acute cardiopulmonary process I agree with the radiologist interpretation CT Angio PE which showed no acute cardiopulmonary disease and no definite evidence of PE   Cardiac Monitoring: / EKG:  The patient was maintained on a cardiac monitor.  I personally viewed and interpreted the cardiac monitored which showed an underlying rhythm of: Sinus rhythm   Problem List / ED Course / Critical interventions / Medication management I ordered medication including toradol  and GI cocktail I have reviewed the patients home medicines and have made adjustments as needed   Test / Admission - Considered:  Considered for admission or further workup however patient's vital signs, physical exam, labs, and imaging are reassuring.  Patient's symptoms likely due to musculoskeletal pain or GERD.  Patient advised to alternate Tylenol /Motrin  as needed for pain.  Patient given return precautions.  I feel patient is safe for discharge at this time.     Final diagnoses:  Atypical chest pain    ED Discharge Orders     None          Francis Ileana SAILOR, PA-C 04/10/24 1327    Francis Ileana SAILOR, PA-C 04/10/24 1349    Long, Fonda MATSU, MD 04/11/24 [phone]

## 2024-04-10 NOTE — Discharge Instructions (Addendum)
 You were seen for chest pain.  Your workup while in the emergency department was reassuring.  Please pick up your naproxen  and take as directed for pain.  You may also take Tylenol  in addition to this medication.  Please follow-up with your primary care if your symptoms persist.  Thank you for letting us  treat you today. After reviewing your labs and imaging, I feel you are safe to go home. Please follow up with your PCP in the next several days and provide them with your records from this visit. Return to the Emergency Room if pain becomes severe or symptoms worsen.

## 2024-04-10 NOTE — ED Notes (Signed)
 Report received from [place], [place]. Assuming pt care at this time.

## 2024-04-10 NOTE — ED Triage Notes (Signed)
 Pt states when she rolls on her left side it hurts under her left breast started at 5 am denies n/v  hurts when she moves her left arm  states has had some sob she states  started at the same time

## 2024-04-10 NOTE — ED Notes (Signed)
 Pt here with 3 y o child also a pt ,  pt states she has pain in left chest when she rolls over in bed under her left arm states had some sob states when asked she had a  blood clot in her lung last year and when asked states she finished her blood thinners denies N/V?sob
